# Patient Record
Sex: Male | Born: 1965 | Race: Black or African American | Hispanic: No | Marital: Married | Smoking: Former smoker
Health system: Southern US, Community
[De-identification: ages and names within clinical notes are randomized; demographics above are authoritative.]

## PROBLEM LIST (undated history)

## (undated) DIAGNOSIS — T7840XA Allergy, unspecified, initial encounter: Secondary | ICD-10-CM

## (undated) HISTORY — DX: Allergy, unspecified, initial encounter: T78.40XA

---

## 1997-07-20 ENCOUNTER — Other Ambulatory Visit: Admission: RE | Admit: 1997-07-20 | Discharge: 1997-07-20 | Payer: Self-pay | Admitting: Internal Medicine

## 2004-10-10 ENCOUNTER — Emergency Department (HOSPITAL_COMMUNITY): Admission: EM | Admit: 2004-10-10 | Discharge: 2004-10-10 | Payer: Self-pay | Admitting: Emergency Medicine

## 2004-12-22 ENCOUNTER — Emergency Department (HOSPITAL_COMMUNITY): Admission: EM | Admit: 2004-12-22 | Discharge: 2004-12-23 | Payer: Self-pay | Admitting: Emergency Medicine

## 2006-07-16 ENCOUNTER — Emergency Department (HOSPITAL_COMMUNITY): Admission: EM | Admit: 2006-07-16 | Discharge: 2006-07-16 | Payer: Self-pay | Admitting: Emergency Medicine

## 2008-09-06 ENCOUNTER — Emergency Department (HOSPITAL_COMMUNITY): Admission: EM | Admit: 2008-09-06 | Discharge: 2008-09-06 | Payer: Self-pay | Admitting: Family Medicine

## 2009-10-07 ENCOUNTER — Emergency Department (HOSPITAL_COMMUNITY): Admission: EM | Admit: 2009-10-07 | Discharge: 2009-10-07 | Payer: Self-pay | Admitting: Emergency Medicine

## 2013-10-13 LAB — HM COLONOSCOPY

## 2013-11-10 ENCOUNTER — Encounter (HOSPITAL_COMMUNITY): Payer: Self-pay | Admitting: Emergency Medicine

## 2013-11-10 ENCOUNTER — Emergency Department (HOSPITAL_COMMUNITY)
Admission: EM | Admit: 2013-11-10 | Discharge: 2013-11-10 | Disposition: A | Discharge status: Home / Self Care | Payer: BC Managed Care – PPO | Source: Home / Self Care

## 2013-11-10 DIAGNOSIS — N451 Epididymitis: Secondary | ICD-10-CM

## 2013-11-10 DIAGNOSIS — N508 Other specified disorders of male genital organs: Secondary | ICD-10-CM

## 2013-11-10 DIAGNOSIS — N50811 Right testicular pain: Secondary | ICD-10-CM

## 2013-11-10 LAB — POCT URINALYSIS DIP (DEVICE)
Bilirubin Urine: NEGATIVE
Glucose, UA: NEGATIVE mg/dL
Hgb urine dipstick: NEGATIVE
Ketones, ur: NEGATIVE mg/dL
Leukocytes, UA: NEGATIVE
Nitrite: NEGATIVE
Protein, ur: 100 mg/dL — AB
Specific Gravity, Urine: 1.025 (ref 1.005–1.030)
Urobilinogen, UA: 0.2 mg/dL (ref 0.0–1.0)
pH: 6 (ref 5.0–8.0)

## 2013-11-10 MED ORDER — DOXYCYCLINE HYCLATE 100 MG PO CAPS
100.0000 mg | ORAL_CAPSULE | Freq: Two times a day (BID) | ORAL | Status: DC
Start: 2013-11-10 — End: 2015-07-24

## 2013-11-10 NOTE — ED Provider Notes (Signed)
CSN: [id number]     Arrival date & time 11/10/13  1242 History   First MD Initiated Contact with Patient 11/10/13 1313     Chief Complaint  Patient presents with  . Groin Pain   (Consider location/radiation/quality/duration/timing/severity/associated sxs/prior Treatment) HPI Comments: 48 year old male complaining of discomfort to the left testicle for approximately one week. The pain is located to the posterior aspect and radiates into the right inguinal. The discomfort is intermittent. It is worse with ambulation and while running for exercise. At this time he is not having any pain. Is not associated with penile discharge or other urinary symptoms. He denies trauma.   History reviewed. No pertinent past medical history. History reviewed. No pertinent past surgical history. No family history on file. History  Substance Use Topics  . Smoking status: Never Smoker   . Smokeless tobacco: Not on file  . Alcohol Use: No    Review of Systems  Constitutional: Positive for activity change. Negative for fever and fatigue.  HENT: Negative.   Respiratory: Negative for cough and shortness of breath.   Cardiovascular: Negative.   Gastrointestinal: Negative.   Genitourinary: Positive for testicular pain. Negative for dysuria, urgency, frequency, hematuria, flank pain, decreased urine volume, discharge, penile swelling, scrotal swelling, genital sores and penile pain.    Allergies  Penicillins  Home Medications   Prior to Admission medications   Medication Sig Start Date End Date Taking? Authorizing Provider  doxycycline (VIBRAMYCIN) 100 MG capsule Take 1 capsule (100 mg total) by mouth 2 (two) times daily. 11/10/13   Hayden Rasmussenavid Kingston Guiles, NP   There were no vitals taken for this visit. Physical Exam  Nursing note and vitals reviewed. Constitutional: He is oriented to person, place, and time. He appears well-developed and well-nourished. No distress.  Neck: Normal range of motion. Neck supple.   Cardiovascular: Normal rate.   Pulmonary/Chest: Effort normal. No respiratory distress.  Genitourinary:  Normal external male genitalia. No external lesions observed palpated. Bilateral testicles are descended. There is no current tenderness of the scrotum, testicles or epididymal apparatus. The patient points to the posterior aspect of the right testicle as the usual source of discomfort. There is no discoloration, observed or palpated intrascrotal mass or fluid. Hernia finger tap testing is negative bilaterally. No epididymal tenderness, although this is an area of recurring discomfort. No penile discharge. No inguinal or pelvic lymph nodes palpated. No pelvic tenderness or mass.   Musculoskeletal: Normal range of motion. He exhibits no edema.  Neurological: He is alert and oriented to person, place, and time. He exhibits normal muscle tone. Coordination normal.  Skin: Skin is warm and dry.  Psychiatric: He has a normal mood and affect.    ED Course  Procedures (including critical care time) Labs Review Labs Reviewed  POCT URINALYSIS DIP (DEVICE) - Abnormal; Notable for the following:    Protein, ur 100 (*)    All other components within normal limits    Imaging Review No results found. Results for orders placed during the hospital encounter of 11/10/13  POCT URINALYSIS DIP (DEVICE)      Result Value Ref Range   Glucose, UA NEGATIVE  NEGATIVE mg/dL   Bilirubin Urine NEGATIVE  NEGATIVE   Ketones, ur NEGATIVE  NEGATIVE mg/dL   Specific Gravity, Urine 1.025  1.005 - 1.030   Hgb urine dipstick NEGATIVE  NEGATIVE   pH 6.0  5.0 - 8.0   Protein, ur 100 (*) NEGATIVE mg/dL   Urobilinogen, UA 0.2  0.0 - 1.0  mg/dL   Nitrite NEGATIVE  NEGATIVE   Leukocytes, UA NEGATIVE  NEGATIVE       MDM   1. Testicular pain, right   2. Epididymitis, right     No evidence of hernia or torsion. Ice elevate scrotum Limit provocative activity Doxy 100 bid F/U with PCP or here if not improving  or getting worse    Hayden Rasmussenavid Jahmeir Geisen, NP 11/10/13 1347

## 2013-11-10 NOTE — Discharge Instructions (Signed)
Epididymitis °Epididymitis is a swelling (inflammation) of the epididymis. The epididymis is a cord-like structure along the back part of the testicle. Epididymitis is usually, but not always, caused by infection. This is usually a sudden problem beginning with chills, fever and pain behind the scrotum and in the testicle. There may be swelling and redness of the testicle. °DIAGNOSIS  °Physical examination will reveal a tender, swollen epididymis. Sometimes, cultures are obtained from the urine or from prostate secretions to help find out if there is an infection or if the cause is a different problem. Sometimes, blood work is performed to see if your white blood cell count is elevated and if a germ (bacterial) or viral infection is present. Using this knowledge, an appropriate medicine which kills germs (antibiotic) can be chosen by your caregiver. A viral infection causing epididymitis will most often go away (resolve) without treatment. °HOME CARE INSTRUCTIONS  °· Hot sitz baths for 20 minutes, 4 times per day, may help relieve pain. °· Only take over-the-counter or prescription medicines for pain, discomfort or fever as directed by your caregiver. °· Take all medicines, including antibiotics, as directed. Take the antibiotics for the full prescribed length of time even if you are feeling better. °· It is very important to keep all follow-up appointments. °SEEK IMMEDIATE MEDICAL CARE IF:  °· You have a fever. °· You have pain not relieved with medicines. °· You have any worsening of your problems. °· Your pain seems to come and go. °· You develop pain, redness, and swelling in the scrotum and surrounding areas. °MAKE SURE YOU:  °· Understand these instructions. °· Will watch your condition. °· Will get help right away if you are not doing well or get worse. °Document Released: 01/05/2000 Document Revised: 04/01/2011 Document Reviewed: 11/24/2008 °ExitCare® Patient Information ©2015 [employer]. This information  is not intended to replace advice given to you by your health care provider. Make sure you discuss any questions you have with your health care provider. ° °Testicular Self-Exam °A self-exam of your testicles is looking at and feeling your testicles for abnormal lumps or swelling. Several things can cause swelling, lumps, or pain in your testicles. Some of these causes are: °· Injuries. °· Puffiness, redness, and soreness (inflammation). °· Infection. °· Extra fluids around your testicle. °· Twisted testicles. °· Testicular cancer. °The testicles are easiest to check after warm baths or showers. They are harder to examine when you are cold.  °Follow these steps while you are standing: °· Hold your penis away from your body. °· Roll one testicle between your thumb and finger. Feel the entire testicle. °· Roll the other testicle between your thumb and finger. Feel the entire testicle. °Feel for lumps, swelling, or discomfort. A normal testicle is egg shaped. It feels firm. It is smooth and not tender. The spermatic cord feels like a firm spaghetti-like cord. It is at the back of your testicle. Examine the crease between the front of your leg and your abdomen. Feel for any bumps that are tender. These could be enlarged lymph nodes.  °Document Released: 04/05/2008 Document Revised: 10/28/2012 Document Reviewed: 06/29/2012 °ExitCare® Patient Information ©2015 [employer]. This information is not intended to replace advice given to you by your health care provider. Make sure you discuss any questions you have with your health care provider. ° °

## 2013-11-10 NOTE — ED Provider Notes (Signed)
Medical screening examination/treatment/procedure(s) were performed by non-physician practitioner and as supervising physician I was immediately available for consultation/collaboration.  Charli Liberatore, M.D.  Daoud Lobue C Mykalah Saari, MD 11/10/13 1423 

## 2013-11-10 NOTE — ED Notes (Signed)
C/o pain on right side inguinal and right teste onset 1 week Pain increases w/activity Denies abd pain, f/v/n/d, urinary sx Alert, no signs of acute distress.

## 2015-07-24 ENCOUNTER — Encounter (HOSPITAL_COMMUNITY): Payer: Self-pay | Admitting: Emergency Medicine

## 2015-07-24 ENCOUNTER — Emergency Department (HOSPITAL_COMMUNITY)
Admission: EM | Admit: 2015-07-24 | Discharge: 2015-07-24 | Disposition: A | Discharge status: Home / Self Care | Payer: BLUE CROSS/BLUE SHIELD | Attending: Emergency Medicine | Admitting: Emergency Medicine

## 2015-07-24 ENCOUNTER — Emergency Department (HOSPITAL_COMMUNITY): Payer: BLUE CROSS/BLUE SHIELD

## 2015-07-24 DIAGNOSIS — Y9389 Activity, other specified: Secondary | ICD-10-CM | POA: Diagnosis not present

## 2015-07-24 DIAGNOSIS — Y999 Unspecified external cause status: Secondary | ICD-10-CM | POA: Insufficient documentation

## 2015-07-24 DIAGNOSIS — S62609B Fracture of unspecified phalanx of unspecified finger, initial encounter for open fracture: Secondary | ICD-10-CM

## 2015-07-24 DIAGNOSIS — Z79899 Other long term (current) drug therapy: Secondary | ICD-10-CM | POA: Insufficient documentation

## 2015-07-24 DIAGNOSIS — W293XXA Contact with powered garden and outdoor hand tools and machinery, initial encounter: Secondary | ICD-10-CM | POA: Diagnosis not present

## 2015-07-24 DIAGNOSIS — S62663B Nondisplaced fracture of distal phalanx of left middle finger, initial encounter for open fracture: Secondary | ICD-10-CM | POA: Diagnosis not present

## 2015-07-24 DIAGNOSIS — Y929 Unspecified place or not applicable: Secondary | ICD-10-CM | POA: Insufficient documentation

## 2015-07-24 DIAGNOSIS — S61213A Laceration without foreign body of left middle finger without damage to nail, initial encounter: Secondary | ICD-10-CM | POA: Diagnosis present

## 2015-07-24 MED ORDER — BACITRACIN ZINC 500 UNIT/GM EX OINT
TOPICAL_OINTMENT | Freq: Two times a day (BID) | CUTANEOUS | Status: DC
  Administered 2015-07-24: 1 via TOPICAL

## 2015-07-24 MED ORDER — TETANUS-DIPHTH-ACELL PERTUSSIS 5-2.5-18.5 LF-MCG/0.5 IM SUSP
0.5000 mL | Freq: Once | INTRAMUSCULAR | Status: AC
  Administered 2015-07-24: 0.5 mL via INTRAMUSCULAR
  Filled 2015-07-24: qty 0.5

## 2015-07-24 MED ORDER — HYDROCODONE-ACETAMINOPHEN 5-325 MG PO TABS: 2.0000 | ORAL_TABLET | ORAL | Status: DC | PRN

## 2015-07-24 MED ORDER — MUPIROCIN CALCIUM 2 % EX CREA
TOPICAL_CREAM | Freq: Once | CUTANEOUS | Status: DC
  Filled 2015-07-24: qty 15

## 2015-07-24 MED ORDER — CIPROFLOXACIN HCL 500 MG PO TABS: 500.0000 mg | ORAL_TABLET | Freq: Two times a day (BID) | ORAL | Status: DC

## 2015-07-24 NOTE — ED Provider Notes (Signed)
CSN: [id number]     Arrival date & time 07/24/15  [id number] History   First MD Initiated Contact with Patient 07/24/15 1001     Chief Complaint  Patient presents with  . Finger Injury     (Consider location/radiation/quality/duration/timing/severity/associated sxs/prior Treatment) HPI  Corey Ayala is a 50 y.o M with No significant past medical history who presents to the ED today complaining of a laceration to his left middle finger. Patient states that he was cutting his hedges with hedge trimmers when one accidentally slipped the end of his left middle finger. Laceration goes through his nail. Bleeding is minimal. He initially washed off with water and applied pressure. His last tetanus was well over 10 years ago.   History reviewed. No pertinent past medical history. History reviewed. No pertinent past surgical history. History reviewed. No pertinent family history. Social History  Substance Use Topics  . Smoking status: Never Smoker   . Smokeless tobacco: None  . Alcohol Use: No    Review of Systems  All other systems reviewed and are negative.     Allergies  Penicillins  Home Medications   Prior to Admission medications   Medication Sig Start Date End Date Taking? Authorizing Provider  Multiple Vitamins-Minerals (MULTIVITAMIN) LIQD Take 5 mLs by mouth daily.   Yes Historical Provider, MD  OVER THE COUNTER MEDICATION Take 1 tablet by mouth daily. Chaga- mushroom..   Yes Historical Provider, MD   BP 114/77 mmHg  Pulse 76  Temp(Src) 98.2 F (36.8 C) (Oral)  Resp 18  SpO2 96% Physical Exam  Constitutional: He is oriented to person, place, and time. He appears well-developed and well-nourished. No distress.  HENT:  Head: Normocephalic and atraumatic.  Eyes: Conjunctivae are normal. Right eye exhibits no discharge. Left eye exhibits no discharge. No scleral icterus.  Cardiovascular: Normal rate.   Pulmonary/Chest: Effort normal.  Musculoskeletal:  No decrease ROM  of digits.   Neurological: He is alert and oriented to person, place, and time. Coordination normal.  No sensory deficits.  Skin: Skin is warm and dry. No rash noted. He is not diaphoretic. No erythema. No pallor.  2cm laceration at distal tip of left middle finger. Laceration extends from middle of nail to finger pad. No disruption of nail fold. Intact distal pulses.   Psychiatric: He has a normal mood and affect. His behavior is normal.  Nursing note and vitals reviewed.   ED Course  Procedures (including critical care time) Labs Review Labs Reviewed - No data to display  Imaging Review Dg Hand Complete Left  07/24/2015  CLINICAL DATA:  Left middle finger are injury, possible fracture EXAM: LEFT HAND - COMPLETE 3+ VIEW COMPARISON:  None. FINDINGS: Three views of the left hand submitted. There is nondisplaced fracture at the tip of distal phalanx third finger. There is soft tissue irregularity probable laceration. Open fracture cannot be excluded. IMPRESSION: There is nondisplaced fracture at the tip of distal phalanx third finger. There is soft tissue irregularity probable laceration. Open fracture cannot be excluded. Clinical correlation is necessary. Electronically Signed   By: Natasha MeadLiviu  Pop M.D.   On: 07/24/2015 11:47   I have personally reviewed and evaluated these images and lab results as part of my medical decision-making.   EKG Interpretation None      MDM   Final diagnoses:  Open fracture of finger, initial encounter    Tdap booster given. Pressure irrigation performed. Xray reveals nondisplaced fx at the tip of distal phalanx. There is an  overlying laceration through the nail without disruption of the nail fold. Fx is considered an open fracture. Spoke with Dr. Ophelia CharterYates who recommends dressing the wound and placing pt on abx. Dr. Ophelia CharterYates will see pt in his office on Friday. Will not repair lac in the ED. Pt has no hx of renal dysfunction, rx for cipro given. Pt has no co morbidities  to effect normal wound healing. Pt is hemodynamically stable w no complaints prior to dc.  Return precautions outlined in patient discharge instructions.      Lester KinsmanSamantha Tripp AntimonyDowless, PA-C 07/24/15 1441  Dione Boozeavid Glick, MD 07/24/15 [id number]

## 2015-07-24 NOTE — ED Notes (Signed)
Pt was trimming hedges this am. Hedge trimmer slipped and hit L middle finger. Pt has laceration through nail and around to pad of finger. Bleeding minimal in triage. Last tetanus over 10 years ago.

## 2015-07-24 NOTE — Discharge Instructions (Signed)
Finger Fracture Fractures of fingers are breaks in the bones of the fingers. There are many types of fractures. There are different ways of treating these fractures. Your health care provider will discuss the best way to treat your fracture. CAUSES Traumatic injury is the main cause of broken fingers. These include:  Injuries while playing sports.  Workplace injuries.  Falls. RISK FACTORS Activities that can increase your risk of finger fractures include:  Sports.  Workplace activities that involve machinery.  A condition called osteoporosis, which can make your bones less dense and cause them to fracture more easily. SIGNS AND SYMPTOMS The main symptoms of a broken finger are pain and swelling within 15 minutes after the injury. Other symptoms include:  Bruising of your finger.  Stiffness of your finger.  Numbness of your finger.  Exposed bones (compound fracture) if the fracture is severe. DIAGNOSIS  The best way to diagnose a broken bone is with X-ray imaging. Additionally, your health care provider will use this X-ray image to evaluate the position of the broken finger bones.  TREATMENT  Finger fractures can be treated with:   Nonreduction--This means the bones are in place. The finger is splinted without changing the positions of the bone pieces. The splint is usually left on for about a week to 10 days. This will depend on your fracture and what your health care provider thinks.  Closed reduction--The bones are put back into position without using surgery. The finger is then splinted.  Open reduction and internal fixation--The fracture site is opened. Then the bone pieces are fixed into place with pins or some type of hardware. This is seldom required. It depends on the severity of the fracture. HOME CARE INSTRUCTIONS   Follow your health care provider's instructions regarding activities, exercises, and physical therapy.  Only take over-the-counter or prescription  medicines for pain, discomfort, or fever as directed by your health care provider. SEEK MEDICAL CARE IF: You have pain or swelling that limits the motion or use of your fingers. SEEK IMMEDIATE MEDICAL CARE IF:  Your finger becomes numb. MAKE SURE YOU:   Understand these instructions.  Will watch your condition.  Will get help right away if you are not doing well or get worse.   This information is not intended to replace advice given to you by your health care provider. Make sure you discuss any questions you have with your health care provider.   Call and schedule with Dr. Ophelia CharterYates for Friday. This is a [employer] surgeon. Take antibiotics as prescribed. You may wash the wound with a torturous up and water. Otherwise keep this clean and dry. Return to the ED sooner if you experience severe worsening of your symptoms, increased redness or swelling around your wound, fevers, chills.

## 2017-05-26 DIAGNOSIS — Z0000 Encounter for general adult medical examination without abnormal findings: Secondary | ICD-10-CM | POA: Diagnosis not present

## 2017-05-26 DIAGNOSIS — E559 Vitamin D deficiency, unspecified: Secondary | ICD-10-CM | POA: Diagnosis not present

## 2017-05-26 DIAGNOSIS — Z125 Encounter for screening for malignant neoplasm of prostate: Secondary | ICD-10-CM | POA: Diagnosis not present

## 2017-05-26 DIAGNOSIS — E669 Obesity, unspecified: Secondary | ICD-10-CM | POA: Diagnosis not present

## 2017-05-26 DIAGNOSIS — R7303 Prediabetes: Secondary | ICD-10-CM | POA: Diagnosis not present

## 2018-06-01 ENCOUNTER — Other Ambulatory Visit: Payer: Self-pay | Admitting: Nurse Practitioner

## 2018-06-01 ENCOUNTER — Other Ambulatory Visit: Payer: Self-pay

## 2018-06-01 ENCOUNTER — Encounter: Payer: Self-pay | Admitting: Nurse Practitioner

## 2018-06-01 ENCOUNTER — Ambulatory Visit (INDEPENDENT_AMBULATORY_CARE_PROVIDER_SITE_OTHER): Payer: 59 | Admitting: Nurse Practitioner

## 2018-06-01 VITALS — BP 110/80 | HR 75 | Temp 98.2°F | Ht 70.8 in | Wt 209.8 lb

## 2018-06-01 DIAGNOSIS — Z1159 Encounter for screening for other viral diseases: Secondary | ICD-10-CM | POA: Diagnosis not present

## 2018-06-01 DIAGNOSIS — R7309 Other abnormal glucose: Secondary | ICD-10-CM

## 2018-06-01 DIAGNOSIS — E559 Vitamin D deficiency, unspecified: Secondary | ICD-10-CM | POA: Diagnosis not present

## 2018-06-01 DIAGNOSIS — Z0000 Encounter for general adult medical examination without abnormal findings: Secondary | ICD-10-CM | POA: Diagnosis not present

## 2018-06-01 DIAGNOSIS — Z125 Encounter for screening for malignant neoplasm of prostate: Secondary | ICD-10-CM | POA: Diagnosis not present

## 2018-06-01 DIAGNOSIS — Z139 Encounter for screening, unspecified: Secondary | ICD-10-CM

## 2018-06-01 LAB — POCT URINALYSIS DIPSTICK
Bilirubin, UA: NEGATIVE
Glucose, UA: NEGATIVE
Ketones, UA: NEGATIVE
Leukocytes, UA: NEGATIVE
Nitrite, UA: NEGATIVE
Protein, UA: POSITIVE — AB
Spec Grav, UA: 1.025 (ref 1.010–1.025)
Urobilinogen, UA: 0.2 E.U./dL
pH, UA: 5.5 (ref 5.0–8.0)

## 2018-06-01 NOTE — Patient Instructions (Signed)
Health Maintenance  Topic Date Due  . COLONOSCOPY  03/02/2013  . INFLUENZA VACCINE  08/22/2018  . TETANUS/TDAP  07/23/2025  . Hepatitis C Screening  Completed  . HIV Screening  Completed   Health Maintenance, Male A healthy lifestyle and preventive care is important for your health and wellness. Ask your health care provider about what schedule of regular examinations is right for you. What should I know about weight and diet? Eat a Healthy Diet  Eat plenty of vegetables, fruits, whole grains, low-fat dairy products, and lean protein.  Do not eat a lot of foods high in solid fats, added sugars, or salt.  Maintain a Healthy Weight Regular exercise can help you achieve or maintain a healthy weight. You should:  Do at least 150 minutes of exercise each week. The exercise should increase your heart rate and make you sweat (moderate-intensity exercise).  Do strength-training exercises at least twice a week. Watch Your Levels of Cholesterol and Blood Lipids  Have your blood tested for lipids and cholesterol every 5 years starting at 55 years of age. If you are at high risk for heart disease, you should start having your blood tested when you are 55 years old. You may need to have your cholesterol levels checked more often if: ? Your lipid or cholesterol levels are high. ? You are older than 55 years of age. ? You are at high risk for heart disease. What should I know about cancer screening? Many types of cancers can be detected early and may often be prevented. Lung Cancer  You should be screened every year for lung cancer if: ? You are a current smoker who has smoked for at least 30 years. ? You are a former smoker who has quit within the past 15 years.  Talk to your health care provider about your screening options, when you should start screening, and how often you should be screened. Colorectal Cancer  Routine colorectal cancer screening usually begins at 55 years of age and  should be repeated every 5-10 years until you are 55 years old. You may need to be screened more often if early forms of precancerous polyps or small growths are found. Your health care provider may recommend screening at an earlier age if you have risk factors for colon cancer.  Your health care provider may recommend using home test kits to check for hidden blood in the stool.  A small camera at the end of a tube can be used to examine your colon (sigmoidoscopy or colonoscopy). This checks for the earliest forms of colorectal cancer. Prostate and Testicular Cancer  Depending on your age and overall health, your health care provider may do certain tests to screen for prostate and testicular cancer.  Talk to your health care provider about any symptoms or concerns you have about testicular or prostate cancer. Skin Cancer  Check your skin from head to toe regularly.  Tell your health care provider about any new moles or changes in moles, especially if: ? There is a change in a mole's size, shape, or color. ? You have a mole that is larger than a pencil eraser.  Always use sunscreen. Apply sunscreen liberally and repeat throughout the day.  Protect yourself by wearing long sleeves, pants, a wide-brimmed hat, and sunglasses when outside. What should I know about heart disease, diabetes, and high blood pressure?  If you are 7118-55 years of age, have your blood pressure checked every 3-5 years. If you are 40  years of age or older, have your blood pressure checked every year. You should have your blood pressure measured twice-once when you are at a hospital or clinic, and once when you are not at a hospital or clinic. Record the average of the two measurements. To check your blood pressure when you are not at a hospital or clinic, you can use: ? An automated blood pressure machine at a pharmacy. ? A home blood pressure monitor.  Talk to your health care provider about your target blood pressure.   If you are between 35-85 years old, ask your health care provider if you should take aspirin to prevent heart disease.  Have regular diabetes screenings by checking your fasting blood sugar level. ? If you are at a normal weight and have a low risk for diabetes, have this test once every three years after the age of 98. ? If you are overweight and have a high risk for diabetes, consider being tested at a younger age or more often.  A one-time screening for abdominal aortic aneurysm (AAA) by ultrasound is recommended for men aged 65-75 years who are current or former smokers. What should I know about preventing infection? Hepatitis B If you have a higher risk for hepatitis B, you should be screened for this virus. Talk with your health care provider to find out if you are at risk for hepatitis B infection. Hepatitis C Blood testing is recommended for:  Everyone born from [birth year] through 1967.  Anyone with known risk factors for hepatitis C. Sexually Transmitted Diseases (STDs)  You should be screened each year for STDs including gonorrhea and chlamydia if: ? You are sexually active and are younger than 55 years of age. ? You are older than 55 years of age and your health care provider tells you that you are at risk for this type of infection. ? Your sexual activity has changed since you were last screened and you are at an increased risk for chlamydia or gonorrhea. Ask your health care provider if you are at risk.  Talk with your health care provider about whether you are at high risk of being infected with HIV. Your health care provider may recommend a prescription medicine to help prevent HIV infection. What else can I do?  Schedule regular health, dental, and eye exams.  Stay current with your vaccines (immunizations).  Do not use any tobacco products, such as cigarettes, chewing tobacco, and e-cigarettes. If you need help quitting, ask your health care provider.  Limit alcohol  intake to no more than 2 drinks per day. One drink equals 12 ounces of beer, 5 ounces of wine, or 1 ounces of hard liquor.  Do not use street drugs.  Do not share needles.  Ask your health care provider for help if you need support or information about quitting drugs.  Tell your health care provider if you often feel depressed.  Tell your health care provider if you have ever been abused or do not feel safe at home. This information is not intended to replace advice given to you by your health care provider. Make sure you discuss any questions you have with your health care provider. Document Released: 07/06/2007 Document Revised: 09/06/2015 Document Reviewed: 10/11/2014 Elsevier Interactive Patient Education  2019 [employer].

## 2018-06-01 NOTE — Progress Notes (Signed)
Subjective:     Patient ID: Corey Ayala , male    DOB: 1965/12/27 , 53 y.o.   MRN: [id number]   Chief Complaint  Patient presents with  . Annual Exam   Men's preventive visit. Patient Health Questionnaire (PHQ-2) is    Office Visit from 06/01/2018 in [facility]  PHQ-2 Total Score  0     Patient is on a regular diet, low carbohydrate, limited sweets and fast food. Marital status: Married. Relevant history for alcohol use is:   Social History   Substance and Sexual Activity  Alcohol Use No   Relevant history for tobacco use is:  Social History   Tobacco Use  Smoking Status Never Smoker  Smokeless Tobacco Never Used   HPI  Here for HM    History reviewed. No pertinent past medical history.   History reviewed. No pertinent family history.   Current Outpatient Medications:  Marland Kitchen  Multiple Vitamin (MULTIVITAMIN) tablet, Take 1 tablet by mouth daily., Disp: , Rfl:    Allergies  Allergen Reactions  . Penicillins Swelling and Rash    Has patient had a PCN reaction causing immediate rash, facial/tongue/throat swelling, SOB or lightheadedness with hypotension: yes Has patient had a PCN reaction causing severe rash involving mucus membranes or skin necrosis: unknown Has patient had a PCN reaction that required hospitalization: yes office visit If all of the above answers are "NO", then may proceed with Cephalosporin use. Has patient had a PCN reaction occurring within the last 10 years: no     Review of Systems  Constitutional: Negative.   HENT: Negative.   Eyes: Negative.   Respiratory: Negative.   Cardiovascular: Negative.   Gastrointestinal: Negative.   Endocrine: Negative.   Genitourinary: Negative.   Musculoskeletal: Negative.   Allergic/Immunologic: Negative.   Neurological: Negative.   Hematological: Negative.   Psychiatric/Behavioral: Negative.      Today's Vitals   06/01/18 1009  BP: 110/80  Pulse: 75  Temp: 98.2 F (36.8  C)  TempSrc: Oral  Weight: 209 lb 12.8 oz (95.2 kg)  Height: 5' 10.8" (1.798 m)  PainSc: 0-No pain   Body mass index is 29.43 kg/m.   Objective:  Physical Exam Constitutional:      Appearance: Normal appearance. He is not ill-appearing.  HENT:     Head: Normocephalic and atraumatic.     Right Ear: Tympanic membrane, ear canal and external ear normal. There is no impacted cerumen.     Left Ear: Tympanic membrane, ear canal and external ear normal. There is no impacted cerumen.  Eyes:     Extraocular Movements: Extraocular movements intact.  Neck:     Musculoskeletal: Normal range of motion and neck supple. Muscular tenderness (left neck tension) present.  Cardiovascular:     Rate and Rhythm: Normal rate and regular rhythm.     Pulses: Normal pulses.     Heart sounds: Normal heart sounds. No murmur.  Pulmonary:     Effort: Pulmonary effort is normal.     Breath sounds: Normal breath sounds.  Abdominal:     General: Abdomen is flat. Bowel sounds are normal. There is no distension.     Palpations: Abdomen is soft.  Genitourinary:    Prostate: Normal.  Musculoskeletal: Normal range of motion.        General: No swelling or tenderness.  Lymphadenopathy:     Cervical: No cervical adenopathy.  Skin:    General: Skin is warm and dry.     Capillary  Refill: Capillary refill takes less than 2 seconds.  Neurological:     General: No focal deficit present.     Mental Status: He is alert and oriented to person, place, and time.     Cranial Nerves: No cranial nerve deficit.  Psychiatric:        Mood and Affect: Mood normal.        Behavior: Behavior normal.        Thought Content: Thought content normal.        Judgment: Judgment normal.         Assessment And Plan:     1. Encounter for general adult medical examination w/o abnormal findings . Behavior modifications discussed and diet history reviewed.   . Pt will continue to exercise regularly and modify diet with low GI,  plant based foods and decrease intake of processed foods.  . Recommend intake of daily multivitamin, Vitamin D, and calcium.  . Recommend for preventive screenings, as well as recommend immunizations that include influenza, TDAP (up to date) - POCT Urinalysis Dipstick (81002) - CBC no Diff - CMP14 + Anion Gap  2. Encounter for hepatitis C screening test for low risk patient  Will check for Hepatitis C screening due to being born between the years 1945-1965 - Hepatitis C antibody  3. Encounter for prostate cancer screening  Manual prostate exam done no abnormal findings. - PSA  4. Encounter for screening  - HIV antibody (with reflex)  5. Vitamin D deficiency  Will check vitamin D level and supplement as needed.     Also encouraged to spend 15 minutes in the sun daily.  - Vitamin D 1,25 Dihydroxy  6. Abnormal glucose Chronic, stable No current medications Encouraged to limit intake of sugary foods and drinks Encouraged to increase physical activity to 150 minutes per week If still elevated will follow up in 3-4 months for recheck   - Hemoglobin A1c    Arnette FeltsJanece Mcgregor Tinnon, FNP    THE PATIENT IS ENCOURAGED TO PRACTICE SOCIAL DISTANCING DUE TO THE COVID-19 PANDEMIC.

## 2018-06-04 LAB — CMP14 + ANION GAP
ALT: 20 IU/L (ref 0–44)
AST: 19 IU/L (ref 0–40)
Albumin/Globulin Ratio: 1.8 (ref 1.2–2.2)
Albumin: 4.6 g/dL (ref 3.8–4.9)
Alkaline Phosphatase: 94 IU/L (ref 39–117)
Anion Gap: 16 mmol/L (ref 10.0–18.0)
BUN/Creatinine Ratio: 13 (ref 9–20)
BUN: 19 mg/dL (ref 6–24)
Bilirubin Total: 0.3 mg/dL (ref 0.0–1.2)
CO2: 19 mmol/L — ABNORMAL LOW (ref 20–29)
Calcium: 9.8 mg/dL (ref 8.7–10.2)
Chloride: 105 mmol/L (ref 96–106)
Creatinine, Ser: 1.42 mg/dL — ABNORMAL HIGH (ref 0.76–1.27)
GFR calc Af Amer: 64 mL/min/{1.73_m2} (ref >59)
GFR calc non Af Amer: 55 mL/min/{1.73_m2} — ABNORMAL LOW (ref >59)
Globulin, Total: 2.5 g/dL (ref 1.5–4.5)
Glucose: 116 mg/dL — ABNORMAL HIGH (ref 65–99)
Potassium: 4.6 mmol/L (ref 3.5–5.2)
Sodium: 140 mmol/L (ref 134–144)
Total Protein: 7.1 g/dL (ref 6.0–8.5)

## 2018-06-04 LAB — HIV ANTIBODY (ROUTINE TESTING W REFLEX): HIV Screen 4th Generation wRfx: NONREACTIVE

## 2018-06-04 LAB — CBC
Hematocrit: 43.8 % (ref 37.5–51.0)
Hemoglobin: 15.5 g/dL (ref 13.0–17.7)
MCH: 29.5 pg (ref 26.6–33.0)
MCHC: 35.4 g/dL (ref 31.5–35.7)
MCV: 83 fL (ref 79–97)
Platelets: 297 10*3/uL (ref 150–450)
RBC: 5.25 x10E6/uL (ref 4.14–5.80)
RDW: 12.3 % (ref 11.6–15.4)
WBC: 4.1 10*3/uL (ref 3.4–10.8)

## 2018-06-04 LAB — HEMOGLOBIN A1C
Est. average glucose Bld gHb Est-mCnc: 134 mg/dL
Hgb A1c MFr Bld: 6.3 % — ABNORMAL HIGH (ref 4.8–5.6)

## 2018-06-04 LAB — PSA: Prostate Specific Ag, Serum: 1.3 ng/mL (ref 0.0–4.0)

## 2018-06-04 LAB — VITAMIN D 1,25 DIHYDROXY
Vitamin D 1, 25 (OH)2 Total: 64 pg/mL
Vitamin D2 1, 25 (OH)2: <10 pg/mL
Vitamin D3 1, 25 (OH)2: 64 pg/mL

## 2018-06-04 LAB — HEPATITIS C ANTIBODY: Hep C Virus Ab: <0.1 s/co ratio (ref 0.0–0.9)

## 2018-10-05 ENCOUNTER — Encounter: Payer: Self-pay | Admitting: Nurse Practitioner

## 2018-10-05 ENCOUNTER — Ambulatory Visit (INDEPENDENT_AMBULATORY_CARE_PROVIDER_SITE_OTHER): Payer: 59 | Admitting: Nurse Practitioner

## 2018-10-05 ENCOUNTER — Other Ambulatory Visit: Payer: Self-pay

## 2018-10-05 VITALS — BP 110/80 | HR 69 | Temp 98.0°F | Ht 71.8 in | Wt 205.2 lb

## 2018-10-05 DIAGNOSIS — R7309 Other abnormal glucose: Secondary | ICD-10-CM | POA: Diagnosis not present

## 2018-10-05 NOTE — Progress Notes (Signed)
  Subjective:     Patient ID: Corey Ayala , male    DOB: January 02, 1966 , 53 y.o.   MRN: [id number]   Chief Complaint  Patient presents with  . PREDIABETES    Patient presents today for a 3-4 month f/u     HPI  Wt Readings from Last 3 Encounters: 10/05/18 : 205 lb 3.2 oz (93.1 kg) 06/01/18 : 209 lb 12.8 oz (95.2 kg)  Tries to avoid fried and fatty foods. Exercising by running 3 days a week and 2 days a week with other physical activity.   Diabetes He presents for his follow-up diabetic visit. Diabetes type: prediabetes. His disease course has been stable. Pertinent negatives for hypoglycemia include no dizziness or headaches. There are no diabetic associated symptoms. Pertinent negatives for diabetes include no chest pain. When asked about current treatments, none were reported.     No past medical history on file.   No family history on file.   Current Outpatient Medications:  Marland Kitchen  Multiple Vitamin (MULTIVITAMIN) tablet, Take 1 tablet by mouth daily., Disp: , Rfl:    Allergies  Allergen Reactions  . Penicillins Swelling and Rash    Has patient had a PCN reaction causing immediate rash, facial/tongue/throat swelling, SOB or lightheadedness with hypotension: yes Has patient had a PCN reaction causing severe rash involving mucus membranes or skin necrosis: unknown Has patient had a PCN reaction that required hospitalization: yes office visit If all of the above answers are "NO", then may proceed with Cephalosporin use. Has patient had a PCN reaction occurring within the last 10 years: no     Review of Systems  Constitutional: Negative.   Respiratory: Negative.  Negative for apnea.   Cardiovascular: Negative.  Negative for chest pain and palpitations.  Neurological: Negative for dizziness and headaches.  Psychiatric/Behavioral: Negative.      Today's Vitals   10/05/18 1011  BP: 110/80  Pulse: 69  Temp: 98 F (36.7 C)  TempSrc: Oral  Weight: 205 lb 3.2 oz (93.1 kg)   Height: 5' 11.8" (1.824 m)  PainSc: 0-No pain   Body mass index is 27.99 kg/m.   Objective:  Physical Exam Constitutional:      Appearance: Normal appearance.  Cardiovascular:     Rate and Rhythm: Normal rate and regular rhythm.     Pulses: Normal pulses.     Heart sounds: Normal heart sounds. No murmur.  Pulmonary:     Effort: Pulmonary effort is normal. No respiratory distress.     Breath sounds: Normal breath sounds.  Skin:    General: Skin is warm.     Capillary Refill: Capillary refill takes less than 2 seconds.  Neurological:     General: No focal deficit present.     Mental Status: He is alert and oriented to person, place, and time.         Assessment And Plan:     1. Abnormal glucose  Chronic, controlled  No current medications  Encouraged to limit intake of sugary foods and drinks  Continue to increase physical activity to 150 minutes per week      Minette Brine, FNP    THE PATIENT IS ENCOURAGED TO PRACTICE SOCIAL DISTANCING DUE TO THE COVID-19 PANDEMIC.

## 2018-10-06 LAB — HEMOGLOBIN A1C
Est. average glucose Bld gHb Est-mCnc: 128 mg/dL
Hgb A1c MFr Bld: 6.1 % — ABNORMAL HIGH (ref 4.8–5.6)

## 2018-11-30 ENCOUNTER — Encounter: Payer: Self-pay | Admitting: Nurse Practitioner

## 2019-02-15 ENCOUNTER — Encounter: Payer: Self-pay | Admitting: Nurse Practitioner

## 2019-02-15 ENCOUNTER — Ambulatory Visit: Payer: 59 | Admitting: Nurse Practitioner

## 2019-02-15 ENCOUNTER — Other Ambulatory Visit: Payer: Self-pay

## 2019-02-15 VITALS — BP 110/76 | HR 68 | Temp 98.5°F | Ht 70.2 in | Wt 211.2 lb

## 2019-02-15 DIAGNOSIS — Z1211 Encounter for screening for malignant neoplasm of colon: Secondary | ICD-10-CM

## 2019-02-15 DIAGNOSIS — J00 Acute nasopharyngitis [common cold]: Secondary | ICD-10-CM

## 2019-02-15 DIAGNOSIS — R7309 Other abnormal glucose: Secondary | ICD-10-CM | POA: Diagnosis not present

## 2019-02-15 DIAGNOSIS — Z20822 Contact with and (suspected) exposure to covid-19: Secondary | ICD-10-CM

## 2019-02-15 NOTE — Patient Instructions (Signed)
COVID-19: Quarantine vs. Isolation QUARANTINE keeps someone who was in close contact with someone who has COVID-19 away from others. If you had close contact with a person who has COVID-19  Stay home until 14 days after your last contact.  Check your temperature twice a day and watch for symptoms of COVID-19.  If possible, stay away from people who are at higher-risk for getting very sick from COVID-19. ISOLATION keeps someone who is sick or tested positive for COVID-19 without symptoms away from others, even in their own home. If you are sick and think or know you have COVID-19  Stay home until after ? At least 10 days since symptoms first appeared and ? At least 24 hours with no fever without fever-reducing medication and ? Symptoms have improved If you tested positive for COVID-19 but do not have symptoms  Stay home until after ? 10 days have passed since your positive test If you live with others, stay in a specific "sick room" or area and away from other people or animals, including pets. Use a separate bathroom, if available. cdc.gov/coronavirus 08/10/2018 This information is not intended to replace advice given to you by your health care provider. Make sure you discuss any questions you have with your health care provider. Document Revised: 12/24/2018 Document Reviewed: 12/24/2018 Elsevier Patient Education  2020 Elsevier Inc.  

## 2019-02-15 NOTE — Progress Notes (Signed)
Subjective:     Patient ID: Corey Ayala , male    DOB: Jun 03, 1965 , 54 y.o.   MRN: [id number]   Chief Complaint  Patient presents with  . prediabetes    patient presents today for a 4 monh f/u on diabetes    HPI  Wt Readings from Last 3 Encounters: 02/15/19 : 211 lb 3.2 oz (95.8 kg) 10/05/18 : 205 lb 3.2 oz (93.1 kg) 06/01/18 : 209 lb 12.8 oz (95.2 kg)  He is taking a multivitamin and a tea (for the immune system).  He is trying to stay well hydrated with water.  Drinks coffee, water and tea, and occasionally drink ginger beer.    On Wednesday had head congestion at night, will wake up with no covers on.  Denies any other symptoms of headache,   Diabetes He presents for his follow-up diabetic visit. Diabetes type: prediabetes. His disease course has been stable. Pertinent negatives for hypoglycemia include no dizziness or headaches. There are no diabetic associated symptoms. Pertinent negatives for diabetes include no chest pain, no fatigue, no polydipsia, no polyphagia, no polyuria and no weakness. There are no hypoglycemic complications. There are no diabetic complications. Risk factors for coronary artery disease include obesity. When asked about current treatments, none were reported. He has not had a previous visit with a dietitian.     No past medical history on file.   No family history on file.   Current Outpatient Medications:  Marland Kitchen  Multiple Vitamin (MULTIVITAMIN) tablet, Take 1 tablet by mouth daily., Disp: , Rfl:    Allergies  Allergen Reactions  . Penicillins Swelling and Rash    Has patient had a PCN reaction causing immediate rash, facial/tongue/throat swelling, SOB or lightheadedness with hypotension: yes Has patient had a PCN reaction causing severe rash involving mucus membranes or skin necrosis: unknown Has patient had a PCN reaction that required hospitalization: yes office visit If all of the above answers are "NO", then may proceed with Cephalosporin  use. Has patient had a PCN reaction occurring within the last 10 years: no     Review of Systems  Constitutional: Negative.  Negative for fatigue.  Respiratory: Negative.  Negative for apnea.   Cardiovascular: Negative.  Negative for chest pain and palpitations.  Endocrine: Negative for polydipsia, polyphagia and polyuria.  Neurological: Negative for dizziness, weakness and headaches.  Psychiatric/Behavioral: Negative.      Today's Vitals   02/15/19 1016  BP: 110/76  Pulse: 68  Temp: 98.5 F (36.9 C)  TempSrc: Oral  Weight: 211 lb 3.2 oz (95.8 kg)  Height: 5' 10.2" (1.783 m)  PainSc: 0-No pain   Body mass index is 30.13 kg/m.   Objective:  Physical Exam Constitutional:      General: He is not in acute distress.    Appearance: Normal appearance.  Cardiovascular:     Rate and Rhythm: Normal rate and regular rhythm.     Pulses: Normal pulses.     Heart sounds: Normal heart sounds. No murmur.  Pulmonary:     Effort: Pulmonary effort is normal. No respiratory distress.     Breath sounds: Normal breath sounds.  Skin:    General: Skin is warm.     Capillary Refill: Capillary refill takes less than 2 seconds.  Neurological:     General: No focal deficit present.     Mental Status: He is alert and oriented to person, place, and time.  Psychiatric:        Mood and Affect:  Mood normal.        Behavior: Behavior normal.        Thought Content: Thought content normal.        Judgment: Judgment normal.         Assessment And Plan:     1. Abnormal glucose  Chronic, stable  Continue with current medications - Hemoglobin A1c - BMP8+eGFR  2. Acute rhinitis Has been having intermittent episodes of rhinitis Due to the pandemic will check for coronavirus Advised to remain in self isolation until he has his results - Novel Coronavirus, NAA ([facility])  3. Encounter for screening colonoscopy  According to USPTF Colorectal cancer Screening guidelines. Colonoscopy is  recommended every 10 years, starting at age 66years.  Will refer to GI for colon cancer screening. - Ambulatory referral to Gastroenterology      Minette Brine, FNP    THE PATIENT IS ENCOURAGED TO PRACTICE SOCIAL DISTANCING DUE TO THE COVID-19 PANDEMIC.

## 2019-02-16 LAB — BMP8+EGFR
BUN/Creatinine Ratio: 15 (ref 9–20)
BUN: 20 mg/dL (ref 6–24)
CO2: 25 mmol/L (ref 20–29)
Calcium: 9.6 mg/dL (ref 8.7–10.2)
Chloride: 104 mmol/L (ref 96–106)
Creatinine, Ser: 1.3 mg/dL — ABNORMAL HIGH (ref 0.76–1.27)
GFR calc Af Amer: 71 mL/min/{1.73_m2} (ref >59)
GFR calc non Af Amer: 61 mL/min/{1.73_m2} (ref >59)
Glucose: 109 mg/dL — ABNORMAL HIGH (ref 65–99)
Potassium: 4.9 mmol/L (ref 3.5–5.2)
Sodium: 143 mmol/L (ref 134–144)

## 2019-02-16 LAB — HEMOGLOBIN A1C
Est. average glucose Bld gHb Est-mCnc: 134 mg/dL
Hgb A1c MFr Bld: 6.3 % — ABNORMAL HIGH (ref 4.8–5.6)

## 2019-02-16 LAB — NOVEL CORONAVIRUS, NAA: SARS-CoV-2, NAA: NOT DETECTED

## 2019-06-07 ENCOUNTER — Encounter: Payer: Self-pay | Admitting: Nurse Practitioner

## 2019-06-07 ENCOUNTER — Ambulatory Visit: Payer: 59 | Admitting: Nurse Practitioner

## 2019-06-07 ENCOUNTER — Other Ambulatory Visit: Payer: Self-pay

## 2019-06-07 VITALS — BP 122/78 | HR 73 | Temp 97.7°F | Ht 69.8 in | Wt 208.0 lb

## 2019-06-07 DIAGNOSIS — Z125 Encounter for screening for malignant neoplasm of prostate: Secondary | ICD-10-CM | POA: Diagnosis not present

## 2019-06-07 DIAGNOSIS — Z0000 Encounter for general adult medical examination without abnormal findings: Secondary | ICD-10-CM | POA: Diagnosis not present

## 2019-06-07 DIAGNOSIS — R7309 Other abnormal glucose: Secondary | ICD-10-CM | POA: Diagnosis not present

## 2019-06-07 DIAGNOSIS — E559 Vitamin D deficiency, unspecified: Secondary | ICD-10-CM | POA: Diagnosis not present

## 2019-06-07 LAB — POCT URINALYSIS DIPSTICK
Bilirubin, UA: NEGATIVE
Blood, UA: NEGATIVE
Glucose, UA: NEGATIVE
Ketones, UA: NEGATIVE
Leukocytes, UA: NEGATIVE
Nitrite, UA: NEGATIVE
Protein, UA: POSITIVE — AB
Spec Grav, UA: >=1.03 — AB (ref 1.010–1.025)
Urobilinogen, UA: 0.2 E.U./dL
pH, UA: 6 (ref 5.0–8.0)

## 2019-06-07 LAB — POCT UA - MICROALBUMIN
Creatinine, POC: 300 mg/dL
Microalbumin Ur, POC: 150 mg/L

## 2019-06-07 NOTE — Patient Instructions (Signed)

## 2019-06-07 NOTE — Progress Notes (Signed)
Subjective:     Patient ID: Corey Ayala , male    DOB: February 19, 1965 , 54 y.o.   MRN: [id number]   Chief Complaint  Patient presents with  . Annual Exam   Men's preventive visit. Patient Health Questionnaire (PHQ-2) is    Office Visit from 02/15/2019 in [facility]  PHQ-2 Total Score  0     Patient is on a regular diet, low carbohydrate, limited sweets and fast food. Marital status: Married. Relevant history for alcohol use is:   HPI Here is here for his HM visit. Had been having knee pain after running will use an herb to apply to the skin with relief, does not feel bad enough for any additional intervention  CDC information on the covid vaccine, he is hesitant about the vaccine.  Exercising by running regularly.   Social History   Substance and Sexual Activity  Alcohol Use No   Relevant history for tobacco use is:  Social History   Tobacco Use  Smoking Status Never Smoker  Smokeless Tobacco Never Used     History reviewed. No pertinent past medical history.   Family History  Problem Relation Age of Onset  . Diabetes Mother   . Hypertension Mother   . Arthritis Mother      Current Outpatient Medications:  Marland Kitchen  Multiple Vitamin (MULTIVITAMIN) tablet, Take 1 tablet by mouth daily., Disp: , Rfl:  .  UNABLE TO FIND, [place]aga Daily, Disp: , Rfl:    Allergies  Allergen Reactions  . Penicillins Swelling and Rash    Has patient had a PCN reaction causing immediate rash, facial/tongue/throat swelling, SOB or lightheadedness with hypotension: yes Has patient had a PCN reaction causing severe rash involving mucus membranes or skin necrosis: unknown Has patient had a PCN reaction that required hospitalization: yes office visit If all of the above answers are "NO", then may proceed with Cephalosporin use. Has patient had a PCN reaction occurring within the last 10 years: no     Review of Systems  Constitutional: Negative.   HENT: Negative.   Eyes:  Negative.   Respiratory: Negative.   Cardiovascular: Negative.   Gastrointestinal: Negative.   Endocrine: Negative.   Genitourinary: Negative.   Musculoskeletal: Positive for arthralgias (Right Knee Pain 3/10).  Allergic/Immunologic: Negative.   Neurological: Negative.   Hematological: Negative.   Psychiatric/Behavioral: Negative.      Today's Vitals   06/07/19 0958  BP: 122/78  Pulse: 73  Temp: 97.7 F (36.5 C)  TempSrc: Oral  Weight: 208 lb (94.3 kg)  Height: 5' 9.8" (1.773 m)  PainSc: 4   PainLoc: Knee   Body mass index is 30.02 kg/m.   Objective:  Physical Exam  Constitutional: He is oriented to person, place, and time. He appears well-developed and well-nourished. He does not appear ill.  HENT:  Head: Normocephalic and atraumatic.  Right Ear: Tympanic membrane, external ear and ear canal normal.  Left Ear: Tympanic membrane, external ear and ear canal normal.  Eyes: Pupils are equal, round, and reactive to light. Conjunctivae, EOM and lids are normal.  Cardiovascular: Normal rate, regular rhythm, normal heart sounds, intact distal pulses and normal pulses.  No murmur heard. Pulses:      Carotid pulses are 2+ on the right side and 2+ on the left side.      Radial pulses are 2+ on the right side and 2+ on the left side.       Femoral pulses are 2+ on the right  side and 2+ on the left side.      Popliteal pulses are 2+ on the right side and 2+ on the left side.       Dorsalis pedis pulses are 2+ on the right side and 2+ on the left side.       Posterior tibial pulses are 2+ on the right side and 2+ on the left side.  Pulmonary/Chest: Effort normal and breath sounds normal.  Abdominal: Soft. Normal appearance and bowel sounds are normal. He exhibits no distension.  Genitourinary:    Prostate normal.   Musculoskeletal:        General: No tenderness. Normal range of motion.     Cervical back: Normal range of motion and neck supple. Muscular tenderness (left neck  tension) present.  Lymphadenopathy:    He has no cervical adenopathy.  Neurological: He is alert and oriented to person, place, and time. No cranial nerve deficit.  Skin: Skin is warm and dry.  Psychiatric: His behavior is normal. Judgment and thought content normal.        Assessment And Plan:     1. Encounter for general adult medical examination w/o abnormal findings . Behavior modifications discussed and diet history reviewed.   . Pt will continue to exercise regularly and modify diet with low GI, plant based foods and decrease intake of processed foods.  . Recommend intake of daily multivitamin, Vitamin D, and calcium.  . Recommend for preventive screenings, as well as recommend immunizations that include influenza, TDAP (up to date) - POCT Urinalysis Dipstick (81002) - CBC no Diff - CMP14 + Anion Gap  2. Encounter for prostate cancer screening  - PSA  3. Abnormal glucose  Chronic, slightly elevated  Advised to increase physical activity and to consider taking benefiber daily to help with glucose control.   - POCT Urinalysis Dipstick (81002) - POCT UA - Microalbumin - Lipid panel - CMP14+EGFR - Hemoglobin A1c  4. Vitamin D deficiency  Will check vitamin D level and supplement as needed.     Also encouraged to spend 15 minutes in the sun daily.  - VITAMIN D 25 Hydroxy (Vit-D Deficiency, Fractures)     Minette Brine, FNP    THE PATIENT IS ENCOURAGED TO PRACTICE SOCIAL DISTANCING DUE TO THE COVID-19 PANDEMIC.

## 2019-06-07 NOTE — Progress Notes (Signed)
Subjective

## 2019-06-08 LAB — CMP14+EGFR
ALT: 19 IU/L (ref 0–44)
AST: 23 IU/L (ref 0–40)
Albumin/Globulin Ratio: 1.5 (ref 1.2–2.2)
Albumin: 4.3 g/dL (ref 3.8–4.9)
Alkaline Phosphatase: 86 IU/L (ref 48–121)
BUN/Creatinine Ratio: 13 (ref 9–20)
BUN: 18 mg/dL (ref 6–24)
Bilirubin Total: 0.6 mg/dL (ref 0.0–1.2)
CO2: 25 mmol/L (ref 20–29)
Calcium: 9.6 mg/dL (ref 8.7–10.2)
Chloride: 100 mmol/L (ref 96–106)
Creatinine, Ser: 1.4 mg/dL — ABNORMAL HIGH (ref 0.76–1.27)
GFR calc Af Amer: 64 mL/min/{1.73_m2} (ref >59)
GFR calc non Af Amer: 56 mL/min/{1.73_m2} — ABNORMAL LOW (ref >59)
Globulin, Total: 2.9 g/dL (ref 1.5–4.5)
Glucose: 102 mg/dL — ABNORMAL HIGH (ref 65–99)
Potassium: 4.6 mmol/L (ref 3.5–5.2)
Sodium: 140 mmol/L (ref 134–144)
Total Protein: 7.2 g/dL (ref 6.0–8.5)

## 2019-06-08 LAB — CBC
Hematocrit: 45.9 % (ref 37.5–51.0)
Hemoglobin: 15.8 g/dL (ref 13.0–17.7)
MCH: 29.5 pg (ref 26.6–33.0)
MCHC: 34.4 g/dL (ref 31.5–35.7)
MCV: 86 fL (ref 79–97)
Platelets: 316 10*3/uL (ref 150–450)
RBC: 5.36 x10E6/uL (ref 4.14–5.80)
RDW: 12.2 % (ref 11.6–15.4)
WBC: 4.4 10*3/uL (ref 3.4–10.8)

## 2019-06-08 LAB — LIPID PANEL
Chol/HDL Ratio: 3.5 ratio (ref 0.0–5.0)
Cholesterol, Total: 222 mg/dL — ABNORMAL HIGH (ref 100–199)
HDL: 64 mg/dL (ref >39)
LDL Chol Calc (NIH): 142 mg/dL — ABNORMAL HIGH (ref 0–99)
Triglycerides: 93 mg/dL (ref 0–149)
VLDL Cholesterol Cal: 16 mg/dL (ref 5–40)

## 2019-06-08 LAB — HEMOGLOBIN A1C
Est. average glucose Bld gHb Est-mCnc: 134 mg/dL
Hgb A1c MFr Bld: 6.3 % — ABNORMAL HIGH (ref 4.8–5.6)

## 2019-06-08 LAB — VITAMIN D 25 HYDROXY (VIT D DEFICIENCY, FRACTURES): Vit D, 25-Hydroxy: 75.7 ng/mL (ref 30.0–100.0)

## 2019-06-08 LAB — PSA: Prostate Specific Ag, Serum: 2.1 ng/mL (ref 0.0–4.0)

## 2019-10-11 ENCOUNTER — Other Ambulatory Visit: Payer: Self-pay

## 2019-10-11 ENCOUNTER — Ambulatory Visit: Payer: 59 | Admitting: Nurse Practitioner

## 2019-10-11 ENCOUNTER — Encounter: Payer: Self-pay | Admitting: Nurse Practitioner

## 2019-10-11 VITALS — BP 126/82 | HR 68 | Temp 97.9°F | Ht 69.8 in | Wt 213.6 lb

## 2019-10-11 DIAGNOSIS — R7309 Other abnormal glucose: Secondary | ICD-10-CM | POA: Diagnosis not present

## 2019-10-11 NOTE — Patient Instructions (Addendum)
Increase your intake of fiber

## 2019-10-11 NOTE — Progress Notes (Signed)
I,Yamilka Roman [employer] as a Neurosurgeon for [employer], FNP.,have documented all relevant documentation on the behalf of [name], FNP,as directed by  [name], FNP while in the presence of [name], FNP.  This visit occurred during the SARS-CoV-2 public health emergency.  Safety protocols were in place, including screening questions prior to the visit, additional usage of staff PPE, and extensive cleaning of exam room while observing appropriate contact time as indicated for disinfecting solutions.  Subjective:     Patient ID: Corey Ayala , male    DOB: 1965/02/18 , 54 y.o.   MRN: [id number]   Chief Complaint  Patient presents with  . abnormal glucose    patient is here for a 4 month f/u    HPI  Here for recheck abnormal glucose.  Continues to exercise regularly.  Does admit to not eating well with having a new.       History reviewed. No pertinent past medical history.   Family History  Problem Relation Age of Onset  . Diabetes Mother   . Hypertension Mother   . Arthritis Mother      Current Outpatient Medications:  Marland Kitchen  Multiple Vitamin (MULTIVITAMIN) tablet, Take 1 tablet by mouth daily., Disp: , Rfl:  .  UNABLE TO FIND, Chaga Daily, Disp: , Rfl:    Allergies  Allergen Reactions  . Penicillins Swelling and Rash    Has patient had a PCN reaction causing immediate rash, facial/tongue/throat swelling, SOB or lightheadedness with hypotension: yes Has patient had a PCN reaction causing severe rash involving mucus membranes or skin necrosis: unknown Has patient had a PCN reaction that required hospitalization: yes office visit If all of the above answers are "NO", then may proceed with Cephalosporin use. Has patient had a PCN reaction occurring within the last 10 years: no     Review of Systems  Constitutional: Negative.   Respiratory: Negative.  Negative for cough.   Cardiovascular: Negative.  Negative for chest pain, palpitations and leg swelling.   Neurological: Negative for dizziness and headaches.  Psychiatric/Behavioral: Negative.      Today's Vitals   10/11/19 1003  BP: 126/82  Pulse: 68  Temp: 97.9 F (36.6 C)  Weight: 213 lb 9.6 oz (96.9 kg)  Height: 5' 9.8" (1.773 m)  PainSc: 0-No pain   Body mass index is 30.82 kg/m.   Objective:  Physical Exam Vitals reviewed.  Constitutional:      General: He is not in acute distress.    Appearance: Normal appearance.  Cardiovascular:     Rate and Rhythm: Normal rate and regular rhythm.     Pulses: Normal pulses.     Heart sounds: No murmur heard.   Pulmonary:     Effort: Pulmonary effort is normal. No respiratory distress.     Breath sounds: Normal breath sounds.  Skin:    Capillary Refill: Capillary refill takes less than 2 seconds.  Neurological:     General: No focal deficit present.     Mental Status: He is alert and oriented to person, place, and time.     Cranial Nerves: No cranial nerve deficit.  Psychiatric:        Mood and Affect: Mood normal.        Behavior: Behavior normal.        Thought Content: Thought content normal.        Judgment: Judgment normal.         Assessment And Plan:     1. Abnormal glucose  Encouraged strongly to monitor his intake of sugary foods and starches (carbohydrates).  Continue exercising regularly. Increase fiber intake     Patient was given opportunity to ask questions. Patient verbalized understanding of the plan and was able to repeat key elements of the plan. All questions were answered to their satisfaction.   Jeanell Sparrow, FNP, have reviewed all documentation for this visit. The documentation on 10/11/19 for the exam, diagnosis, procedures, and orders are all accurate and complete.   THE PATIENT IS ENCOURAGED TO PRACTICE SOCIAL DISTANCING DUE TO THE COVID-19 PANDEMIC.

## 2020-01-03 ENCOUNTER — Encounter: Payer: Self-pay | Admitting: Nurse Practitioner

## 2020-01-03 ENCOUNTER — Ambulatory Visit: Payer: 59 | Admitting: Nurse Practitioner

## 2020-01-03 ENCOUNTER — Other Ambulatory Visit: Payer: Self-pay

## 2020-01-03 VITALS — BP 122/80 | HR 73 | Temp 97.9°F | Ht 70.0 in | Wt 212.8 lb

## 2020-01-03 DIAGNOSIS — R109 Unspecified abdominal pain: Secondary | ICD-10-CM | POA: Diagnosis not present

## 2020-01-03 DIAGNOSIS — R7309 Other abnormal glucose: Secondary | ICD-10-CM | POA: Diagnosis not present

## 2020-01-03 DIAGNOSIS — R808 Other proteinuria: Secondary | ICD-10-CM

## 2020-01-03 LAB — POCT URINALYSIS DIPSTICK
Bilirubin, UA: NEGATIVE
Blood, UA: NEGATIVE
Glucose, UA: NEGATIVE
Ketones, UA: NEGATIVE
Leukocytes, UA: NEGATIVE
Nitrite, UA: NEGATIVE
Protein, UA: POSITIVE — AB
Spec Grav, UA: >=1.03 — AB (ref 1.010–1.025)
Urobilinogen, UA: 0.2 E.U./dL
pH, UA: 5.5 (ref 5.0–8.0)

## 2020-01-03 NOTE — Progress Notes (Signed)
I,Yamilka Roman [employer] as a Neurosurgeon for [employer], FNP.,have documented all relevant documentation on the behalf of [name], FNP,as directed by  [name], FNP while in the presence of [name], FNP. This visit occurred during the SARS-CoV-2 public health emergency.  Safety protocols were in place, including screening questions prior to the visit, additional usage of staff PPE, and extensive cleaning of exam room while observing appropriate contact time as indicated for disinfecting solutions.  Subjective:     Patient ID: Corey Ayala , male    DOB: Jan 19, 1966 , 54 y.o.   MRN: [id number]   Chief Complaint  Patient presents with  . abnormal glucose    HPI  Here for 3 month follow up for his abnormal glucose.  He has cut back on sweet tea.  Continues to exercise but is a little less due to having a pain to his left flank area (eased up on running) has been walking with his wife.     History reviewed. No pertinent past medical history.   Family History  Problem Relation Age of Onset  . Diabetes Mother   . Hypertension Mother   . Arthritis Mother      Current Outpatient Medications:  Marland Kitchen  Multiple Vitamin (MULTIVITAMIN) tablet, Take 1 tablet by mouth daily., Disp: , Rfl:  .  UNABLE TO FIND, Chaga Daily, Disp: , Rfl:  .  UNABLE TO FIND, Ganoderma lucidum: take one capsule daily, Disp: , Rfl:    Allergies  Allergen Reactions  . Penicillins Swelling and Rash    Has patient had a PCN reaction causing immediate rash, facial/tongue/throat swelling, SOB or lightheadedness with hypotension: yes Has patient had a PCN reaction causing severe rash involving mucus membranes or skin necrosis: unknown Has patient had a PCN reaction that required hospitalization: yes office visit If all of the above answers are "NO", then may proceed with Cephalosporin use. Has patient had a PCN reaction occurring within the last 10 years: no     Review of Systems  Constitutional:  Negative.   Respiratory: Negative.   Cardiovascular: Negative.  Negative for chest pain, palpitations and leg swelling.  Musculoskeletal: Positive for back pain (flank tenderness, had slept on a bed that was not as comfortable. improved now. Has used a CBD rub with menthol).  Neurological: Negative for dizziness.  Psychiatric/Behavioral: Negative.      Today's Vitals   01/03/20 1040  BP: 122/80  Pulse: 73  Temp: 97.9 F (36.6 C)  TempSrc: Oral  Weight: 212 lb 12.8 oz (96.5 kg)  Height: 5\' 10"  (1.778 m)  PainSc: 3   PainLoc: Hip   Body mass index is 30.53 kg/m.   Objective:  Physical Exam Vitals reviewed.  Constitutional:      General: He is not in acute distress.    Appearance: Normal appearance. He is obese.  Cardiovascular:     Rate and Rhythm: Normal rate and regular rhythm.     Pulses: Normal pulses.     Heart sounds: Normal heart sounds. No murmur heard.   Pulmonary:     Effort: Pulmonary effort is normal. No respiratory distress.     Breath sounds: Normal breath sounds. No wheezing.  Skin:    Capillary Refill: Capillary refill takes less than 2 seconds.  Neurological:     General: No focal deficit present.     Mental Status: He is alert and oriented to person, place, and time.     Cranial Nerves: No cranial nerve deficit.  Psychiatric:  Mood and Affect: Mood normal.        Behavior: Behavior normal.        Thought Content: Thought content normal.        Judgment: Judgment normal.         Assessment And Plan:     1. Abnormal glucose  Chronic, was slightly up at last visit   He has made changes to his diet   Will recheck HgbA1c - Hemoglobin A1c  2. Flank pain, acute  Negative CVA tenderness  Negative urinalysis but does have protein the last couple times he had urine done - POCT Urinalysis Dipstick (20254) - US Renal; Future  3. Other proteinuria  Will check renal ultrasound - US Renal; Future     Patient was given opportunity  to ask questions. Patient verbalized understanding of the plan and was able to repeat key elements of the plan. All questions were answered to their satisfaction.   Jeanell Sparrow, FNP, have reviewed all documentation for this visit. The documentation on 01/09/20 for the exam, diagnosis, procedures, and orders are all accurate and complete.  THE PATIENT IS ENCOURAGED TO PRACTICE SOCIAL DISTANCING DUE TO THE COVID-19 PANDEMIC.

## 2020-01-04 LAB — HEMOGLOBIN A1C
Est. average glucose Bld gHb Est-mCnc: 114 mg/dL
Hgb A1c MFr Bld: 5.6 % (ref 4.8–5.6)

## 2020-01-25 ENCOUNTER — Ambulatory Visit
Admission: RE | Admit: 2020-01-25 | Discharge: 2020-01-25 | Disposition: A | Discharge status: Home / Self Care | Payer: 59 | Source: Ambulatory Visit | Attending: Nurse Practitioner | Admitting: Nurse Practitioner

## 2020-01-25 DIAGNOSIS — R109 Unspecified abdominal pain: Secondary | ICD-10-CM

## 2020-01-25 DIAGNOSIS — R808 Other proteinuria: Secondary | ICD-10-CM

## 2020-02-08 ENCOUNTER — Other Ambulatory Visit: Payer: Self-pay | Admitting: Nurse Practitioner

## 2020-02-08 DIAGNOSIS — R109 Unspecified abdominal pain: Secondary | ICD-10-CM

## 2020-02-24 ENCOUNTER — Ambulatory Visit
Admission: RE | Admit: 2020-02-24 | Discharge: 2020-02-24 | Disposition: A | Discharge status: Home / Self Care | Payer: 59 | Source: Ambulatory Visit | Attending: Nurse Practitioner | Admitting: Nurse Practitioner

## 2020-02-24 ENCOUNTER — Other Ambulatory Visit: Payer: Self-pay

## 2020-02-24 DIAGNOSIS — R109 Unspecified abdominal pain: Secondary | ICD-10-CM

## 2020-02-24 MED ORDER — IOPAMIDOL (ISOVUE-300) INJECTION 61%
100.0000 mL | Freq: Once | INTRAVENOUS | Status: AC | PRN
  Administered 2020-02-24: 100 mL via INTRAVENOUS

## 2020-03-13 NOTE — Progress Notes (Signed)
I would limit peanuts this can sometimes flare diverticulosis

## 2020-06-12 ENCOUNTER — Other Ambulatory Visit: Payer: Self-pay

## 2020-06-12 ENCOUNTER — Encounter: Payer: Self-pay | Admitting: Nurse Practitioner

## 2020-06-12 ENCOUNTER — Ambulatory Visit (INDEPENDENT_AMBULATORY_CARE_PROVIDER_SITE_OTHER): Payer: 59 | Admitting: Nurse Practitioner

## 2020-06-12 VITALS — BP 114/70 | HR 68 | Temp 97.6°F | Ht 70.0 in | Wt 214.4 lb

## 2020-06-12 DIAGNOSIS — Z0000 Encounter for general adult medical examination without abnormal findings: Secondary | ICD-10-CM

## 2020-06-12 DIAGNOSIS — E6609 Other obesity due to excess calories: Secondary | ICD-10-CM | POA: Diagnosis not present

## 2020-06-12 DIAGNOSIS — R7309 Other abnormal glucose: Secondary | ICD-10-CM | POA: Diagnosis not present

## 2020-06-12 DIAGNOSIS — Z125 Encounter for screening for malignant neoplasm of prostate: Secondary | ICD-10-CM

## 2020-06-12 DIAGNOSIS — E7800 Pure hypercholesterolemia, unspecified: Secondary | ICD-10-CM

## 2020-06-12 DIAGNOSIS — R808 Other proteinuria: Secondary | ICD-10-CM

## 2020-06-12 DIAGNOSIS — E559 Vitamin D deficiency, unspecified: Secondary | ICD-10-CM | POA: Diagnosis not present

## 2020-06-12 DIAGNOSIS — Z6830 Body mass index (BMI) 30.0-30.9, adult: Secondary | ICD-10-CM

## 2020-06-12 LAB — POCT URINALYSIS DIPSTICK
Bilirubin, UA: NEGATIVE
Blood, UA: NEGATIVE
Glucose, UA: NEGATIVE
Ketones, UA: NEGATIVE
Leukocytes, UA: NEGATIVE
Nitrite, UA: NEGATIVE
Protein, UA: POSITIVE — AB
Spec Grav, UA: >=1.03 — AB (ref 1.010–1.025)
Urobilinogen, UA: 0.2 E.U./dL
pH, UA: 5.5 (ref 5.0–8.0)

## 2020-06-12 NOTE — Patient Instructions (Signed)

## 2020-06-12 NOTE — Progress Notes (Signed)
I,Yamilka Roman [employer] as a Education administrator for [employer], FNP.,have documented all relevant documentation on the behalf of Minette Brine, FNP,as directed by  Minette Brine, FNP while in the presence of Minette Brine, [place]. This visit occurred during the SARS-CoV-2 public health emergency.  Safety protocols were in place, including screening questions prior to the visit, additional usage of staff PPE, and extensive cleaning of exam room while observing appropriate contact time as indicated for disinfecting solutions.  Subjective:     Patient ID: Corey Ayala , male    DOB: 09-Mar-1965 , 55 y.o.   MRN: [id number]   Chief Complaint  Patient presents with   Annual Exam    HPI  Patient here for hm.    Wt Readings from Last 3 Encounters: 06/12/20 : 214 lb 6.4 oz (97.3 kg) 01/03/20 : 212 lb 12.8 oz (96.5 kg) 10/11/19 : 213 lb 9.6 oz (96.9 kg)  His scale at home is 208 without any clothes.    History reviewed. No pertinent past medical history.   Family History  Problem Relation Age of Onset   Diabetes Mother    Hypertension Mother    Arthritis Mother      Current Outpatient Medications:    Multiple Vitamin (MULTIVITAMIN) tablet, Take 1 tablet by mouth daily., Disp: , Rfl:    UNABLE TO FIND, Chaga Daily, Disp: , Rfl:    UNABLE TO FIND, Ganoderma lucidum: take one capsule daily, Disp: , Rfl:    Allergies  Allergen Reactions   Penicillins Swelling and Rash    Has patient had a PCN reaction causing immediate rash, facial/tongue/throat swelling, SOB or lightheadedness with hypotension: yes Has patient had a PCN reaction causing severe rash involving mucus membranes or skin necrosis: unknown Has patient had a PCN reaction that required hospitalization: yes office visit If all of the above answers are "NO", then may proceed with Cephalosporin use. Has patient had a PCN reaction occurring within the last 10 years: no     Men's preventive visit. Patient Health Questionnaire (PHQ-2)  is  [place] Office Visit from 06/12/2020 in [facility]  PHQ-2 Total Score 0      Patient is on a Regular diet; admits to not eating as healthy during the time his son graduated from college. He is staying away from raw and no sugar. Limits salt intake. Exercising 2-3 times a week. He is running at least 2 times a week. Marital status: Married. Relevant history for alcohol use is:  Social History   Substance and Sexual Activity  Alcohol Use No   Relevant history for tobacco use is:  Social History   Tobacco Use  Smoking Status Never  Smokeless Tobacco Never  .   Review of Systems  Constitutional: Negative.   HENT: Negative.    Eyes: Negative.   Respiratory: Negative.  Negative for shortness of breath and wheezing.   Cardiovascular: Negative.   Gastrointestinal: Negative.   Genitourinary: Negative.   Musculoskeletal: Negative.   Skin: Negative.   Neurological: Negative.   Hematological: Negative.   Psychiatric/Behavioral: Negative.      Today's Vitals   06/12/20 1000  BP: 114/70  Pulse: 68  Temp: 97.6 F (36.4 C)  Weight: 214 lb 6.4 oz (97.3 kg)  Height: 5' 10"  (1.778 m)  PainSc: 0-No pain   Body mass index is 30.76 kg/m.   Objective:  Physical Exam Vitals reviewed.  Constitutional:      General: He is not in acute distress.  Appearance: Normal appearance. He is obese.  HENT:     Head: Normocephalic and atraumatic.     Right Ear: Tympanic membrane, ear canal and external ear normal. There is no impacted cerumen.     Left Ear: Tympanic membrane, ear canal and external ear normal. There is no impacted cerumen.     Nose:     Comments: Deferred - masked    Mouth/Throat:     Comments: Deferred - masked Eyes:     Extraocular Movements: Extraocular movements intact.     Pupils: Pupils are equal, round, and reactive to light.  Cardiovascular:     Rate and Rhythm: Normal rate and regular rhythm.     Pulses: Normal pulses.      Heart sounds: Normal heart sounds. No murmur heard. Pulmonary:     Effort: Pulmonary effort is normal. No respiratory distress.     Breath sounds: Normal breath sounds. No wheezing.  Abdominal:     General: Abdomen is flat. Bowel sounds are normal. There is no distension.     Palpations: Abdomen is soft.  Genitourinary:    Prostate: Normal.     Rectum: Guaiac result negative (negative).  Musculoskeletal:        General: No swelling or tenderness. Normal range of motion.     Cervical back: Normal range of motion and neck supple.  Skin:    General: Skin is warm.     Capillary Refill: Capillary refill takes less than 2 seconds.  Neurological:     General: No focal deficit present.     Mental Status: He is alert and oriented to person, place, and time.     Cranial Nerves: No cranial nerve deficit.  Psychiatric:        Mood and Affect: Mood normal.        Behavior: Behavior normal.        Thought Content: Thought content normal.        Judgment: Judgment normal.        Assessment And Plan:    1. Encounter for general adult medical examination w/o abnormal findings Behavior modifications discussed and diet history reviewed.   Pt will continue to exercise regularly and modify diet with low GI, plant based foods and decrease intake of processed foods.  Recommend intake of daily multivitamin, Vitamin D, and calcium.  Recommend colonoscopy for preventive screenings, as well as recommend immunizations that include influenza, TDAP  2. Encounter for prostate cancer screening - PSA  3. Class 1 obesity due to excess calories with body mass index (BMI) of 30.0 to 30.9 in adult, unspecified whether serious comorbidity present Chronic Discussed healthy diet and regular exercise options  Encouraged to exercise at least 150 minutes per week with 2 days of strength training  4. Abnormal glucose Comments: encouraged to limit intake of sugary foods HgbA1c is stable - CMP14+EGFR - CBC no  Diff - Hemoglobin A1c  5. Vitamin D deficiency Will check vitamin D level and supplement as needed.    Also encouraged to spend 15 minutes in the sun daily.   6. Elevated cholesterol Comments: will check lipid panel encouraged to limit intake of fried and fatty foods - Lipid panel  7. Other proteinuria Comments: if continues to have protein in urine will check renal ultrasound - POCT Urinalysis Dipstick (11914)     Patient was given opportunity to ask questions. Patient verbalized understanding of the plan and was able to repeat key elements of the plan. All questions were answered  to their satisfaction.   Minette Brine, FNP   I, Minette Brine, FNP, have reviewed all documentation for this visit. The documentation on 05/2320 for the exam, diagnosis, procedures, and orders are all accurate and complete.   THE PATIENT IS ENCOURAGED TO PRACTICE SOCIAL DISTANCING DUE TO THE COVID-19 PANDEMIC.

## 2020-06-13 LAB — CBC
Hematocrit: 42.7 % (ref 37.5–51.0)
Hemoglobin: 14.5 g/dL (ref 13.0–17.7)
MCH: 29.2 pg (ref 26.6–33.0)
MCHC: 34 g/dL (ref 31.5–35.7)
MCV: 86 fL (ref 79–97)
Platelets: 286 10*3/uL (ref 150–450)
RBC: 4.96 x10E6/uL (ref 4.14–5.80)
RDW: 12.1 % (ref 11.6–15.4)
WBC: 4.1 10*3/uL (ref 3.4–10.8)

## 2020-06-13 LAB — CMP14+EGFR
ALT: 16 IU/L (ref 0–44)
AST: 19 IU/L (ref 0–40)
Albumin/Globulin Ratio: 1.6 (ref 1.2–2.2)
Albumin: 4.2 g/dL (ref 3.8–4.9)
Alkaline Phosphatase: 97 IU/L (ref 44–121)
BUN/Creatinine Ratio: 14 (ref 9–20)
BUN: 17 mg/dL (ref 6–24)
Bilirubin Total: 0.3 mg/dL (ref 0.0–1.2)
CO2: 22 mmol/L (ref 20–29)
Calcium: 9.3 mg/dL (ref 8.7–10.2)
Chloride: 106 mmol/L (ref 96–106)
Creatinine, Ser: 1.21 mg/dL (ref 0.76–1.27)
Globulin, Total: 2.6 g/dL (ref 1.5–4.5)
Glucose: 108 mg/dL — ABNORMAL HIGH (ref 65–99)
Potassium: 4.8 mmol/L (ref 3.5–5.2)
Sodium: 142 mmol/L (ref 134–144)
Total Protein: 6.8 g/dL (ref 6.0–8.5)
eGFR: 70 mL/min/{1.73_m2} (ref >59)

## 2020-06-13 LAB — HEMOGLOBIN A1C
Est. average glucose Bld gHb Est-mCnc: 137 mg/dL
Hgb A1c MFr Bld: 6.4 % — ABNORMAL HIGH (ref 4.8–5.6)

## 2020-06-13 LAB — LIPID PANEL
Chol/HDL Ratio: 2.8 ratio (ref 0.0–5.0)
Cholesterol, Total: 190 mg/dL (ref 100–199)
HDL: 68 mg/dL (ref >39)
LDL Chol Calc (NIH): 110 mg/dL — ABNORMAL HIGH (ref 0–99)
Triglycerides: 63 mg/dL (ref 0–149)
VLDL Cholesterol Cal: 12 mg/dL (ref 5–40)

## 2020-06-13 LAB — PSA: Prostate Specific Ag, Serum: 2.1 ng/mL (ref 0.0–4.0)

## 2020-10-23 ENCOUNTER — Encounter: Payer: Self-pay | Admitting: Nurse Practitioner

## 2020-10-23 ENCOUNTER — Other Ambulatory Visit: Payer: Self-pay

## 2020-10-23 ENCOUNTER — Ambulatory Visit: Payer: 59 | Admitting: Nurse Practitioner

## 2020-10-23 VITALS — BP 114/82 | HR 69 | Temp 97.9°F | Ht 70.0 in | Wt 209.2 lb

## 2020-10-23 DIAGNOSIS — E7800 Pure hypercholesterolemia, unspecified: Secondary | ICD-10-CM

## 2020-10-23 DIAGNOSIS — E559 Vitamin D deficiency, unspecified: Secondary | ICD-10-CM | POA: Diagnosis not present

## 2020-10-23 DIAGNOSIS — R7309 Other abnormal glucose: Secondary | ICD-10-CM | POA: Diagnosis not present

## 2020-10-23 DIAGNOSIS — E6609 Other obesity due to excess calories: Secondary | ICD-10-CM

## 2020-10-23 DIAGNOSIS — Z6830 Body mass index (BMI) 30.0-30.9, adult: Secondary | ICD-10-CM

## 2020-10-23 NOTE — Progress Notes (Signed)
I,Katawbba Wiggins,acting as a Education administrator for [employer], FNP.,have documented all relevant documentation on the behalf of Minette Brine, FNP,as directed by  Minette Brine, FNP while in the presence of Minette Brine, [place].   This visit occurred during the SARS-CoV-2 public health emergency.  Safety protocols were in place, including screening questions prior to the visit, additional usage of staff PPE, and extensive cleaning of exam room while observing appropriate contact time as indicated for disinfecting solutions.  Subjective:     Patient ID: Corey Ayala , male    DOB: May 09, 1965 , 55 y.o.   MRN: [id number]   Chief Complaint  Patient presents with   Prediabetes   Hyperlipidemia    HPI  Here for 3 month follow up for his abnormal glucose and cholesterol f/u.  Does admit to eating bad foods while out of town. He did have covid in August  Wt Readings from Last 3 Encounters: 10/23/20 : 209 lb 3.2 oz (94.9 kg) 06/12/20 : 214 lb 6.4 oz (97.3 kg) 01/03/20 : 212 lb 12.8 oz (96.5 kg)      History reviewed. No pertinent past medical history.   Family History  Problem Relation Age of Onset   Diabetes Mother    Hypertension Mother    Arthritis Mother      Current Outpatient Medications:    Multiple Vitamin (MULTIVITAMIN) tablet, Take 1 tablet by mouth daily., Disp: , Rfl:    UNABLE TO FIND, Chaga Daily, Disp: , Rfl:    UNABLE TO FIND, Ganoderma lucidum: take one capsule daily, Disp: , Rfl:    Allergies  Allergen Reactions   Penicillins Swelling and Rash    Has patient had a PCN reaction causing immediate rash, facial/tongue/throat swelling, SOB or lightheadedness with hypotension: yes Has patient had a PCN reaction causing severe rash involving mucus membranes or skin necrosis: unknown Has patient had a PCN reaction that required hospitalization: yes office visit If all of the above answers are "NO", then may proceed with Cephalosporin use. Has patient had a PCN reaction  occurring within the last 10 years: no     Review of Systems  Constitutional: Negative.   HENT: Negative.    Respiratory: Negative.    Cardiovascular: Negative.   Gastrointestinal: Negative.   Neurological: Negative.  Negative for dizziness and headaches.  Psychiatric/Behavioral: Negative.    All other systems reviewed and are negative.   Today's Vitals   10/23/20 1001  BP: 114/82  Pulse: 69  Temp: 97.9 F (36.6 C)  Weight: 209 lb 3.2 oz (94.9 kg)  Height: 5' 10" (1.778 m)   Body mass index is 30.02 kg/m.  Wt Readings from Last 3 Encounters:  10/23/20 209 lb 3.2 oz (94.9 kg)  06/12/20 214 lb 6.4 oz (97.3 kg)  01/03/20 212 lb 12.8 oz (96.5 kg)    BP Readings from Last 3 Encounters:  10/23/20 114/82  06/12/20 114/70  01/03/20 122/80    Objective:  Physical Exam Vitals reviewed.  Constitutional:      General: He is not in acute distress.    Appearance: Normal appearance.  Cardiovascular:     Rate and Rhythm: Normal rate and regular rhythm.     Pulses: Normal pulses.     Heart sounds: Normal heart sounds. No murmur heard. Pulmonary:     Effort: Pulmonary effort is normal. No respiratory distress.     Breath sounds: Normal breath sounds. No wheezing.  Skin:    General: Skin is warm and dry.  Capillary Refill: Capillary refill takes less than 2 seconds.  Neurological:     General: No focal deficit present.     Mental Status: He is alert and oriented to person, place, and time.     Cranial Nerves: No cranial nerve deficit.     Motor: No weakness.  Psychiatric:        Mood and Affect: Mood normal.        Behavior: Behavior normal.        Thought Content: Thought content normal.        Assessment And Plan:     1. Abnormal glucose Chronic, stable.  Continue with current medications Encouraged to limit intake of sugary foods and drinks Encouraged to continue with his physical activity to 150 minutes per week - BMP8+EGFR - Hemoglobin A1c  2. Vitamin D  deficiency Will check vitamin D level and supplement as needed.    Also encouraged to spend 15 minutes in the sun daily.  - VITAMIN D 25 Hydroxy (Vit-D Deficiency, Fractures)  3. Elevated cholesterol LDLs were slightly elevated at last visit but there was improvement to normal with total cholesterol and LDLs were higher one year ago Will check lipid panel  - Lipid panel  4. Class 1 obesity due to excess calories with body mass index (BMI) of 30.0 to 30.9 in adult, unspecified whether serious comorbidity present  He is encouraged to initially strive for BMI less than 30 to decrease cardiac risk. Congratulated on his 5 lb weight loss since his last visit.  He is advised to exercise no less than 150 minutes per week.    Patient was given opportunity to ask questions. Patient verbalized understanding of the plan and was able to repeat key elements of the plan. All questions were answered to their satisfaction.  Minette Brine, FNP   I, Minette Brine, FNP, have reviewed all documentation for this visit. The documentation on 10/23/20 for the exam, diagnosis, procedures, and orders are all accurate and complete.   IF YOU HAVE BEEN REFERRED TO A SPECIALIST, IT MAY TAKE 1-2 WEEKS TO SCHEDULE/PROCESS THE REFERRAL. IF YOU HAVE NOT HEARD FROM US/SPECIALIST IN TWO WEEKS, PLEASE GIVE [place] A CALL AT [phone].   THE PATIENT IS ENCOURAGED TO PRACTICE SOCIAL DISTANCING DUE TO THE COVID-19 PANDEMIC.

## 2020-10-23 NOTE — Patient Instructions (Signed)

## 2020-10-24 ENCOUNTER — Encounter: Payer: Self-pay | Admitting: Nurse Practitioner

## 2020-10-24 LAB — HEMOGLOBIN A1C
Est. average glucose Bld gHb Est-mCnc: 137 mg/dL
Hgb A1c MFr Bld: 6.4 % — ABNORMAL HIGH (ref 4.8–5.6)

## 2020-10-24 LAB — LIPID PANEL
Chol/HDL Ratio: 3.9 ratio (ref 0.0–5.0)
Cholesterol, Total: 220 mg/dL — ABNORMAL HIGH (ref 100–199)
HDL: 56 mg/dL (ref >39)
LDL Chol Calc (NIH): 146 mg/dL — ABNORMAL HIGH (ref 0–99)
Triglycerides: 99 mg/dL (ref 0–149)
VLDL Cholesterol Cal: 18 mg/dL (ref 5–40)

## 2020-10-24 LAB — BMP8+EGFR
BUN/Creatinine Ratio: 13 (ref 9–20)
BUN: 17 mg/dL (ref 6–24)
CO2: 23 mmol/L (ref 20–29)
Calcium: 9.3 mg/dL (ref 8.7–10.2)
Chloride: 104 mmol/L (ref 96–106)
Creatinine, Ser: 1.28 mg/dL — ABNORMAL HIGH (ref 0.76–1.27)
Glucose: 114 mg/dL — ABNORMAL HIGH (ref 70–99)
Potassium: 4.7 mmol/L (ref 3.5–5.2)
Sodium: 141 mmol/L (ref 134–144)
eGFR: 65 mL/min/{1.73_m2} (ref >59)

## 2020-10-24 LAB — VITAMIN D 25 HYDROXY (VIT D DEFICIENCY, FRACTURES): Vit D, 25-Hydroxy: 138 ng/mL — ABNORMAL HIGH (ref 30.0–100.0)

## 2021-06-13 ENCOUNTER — Encounter: Payer: 59 | Admitting: Nurse Practitioner

## 2021-11-15 DIAGNOSIS — M79672 Pain in left foot: Secondary | ICD-10-CM | POA: Insufficient documentation

## 2021-12-06 LAB — HM DIABETES EYE EXAM

## 2021-12-12 ENCOUNTER — Encounter: Payer: Self-pay | Admitting: Nurse Practitioner

## 2021-12-12 ENCOUNTER — Ambulatory Visit (INDEPENDENT_AMBULATORY_CARE_PROVIDER_SITE_OTHER): Payer: 59 | Admitting: Nurse Practitioner

## 2021-12-12 VITALS — BP 112/68 | HR 64 | Temp 97.8°F | Ht 70.0 in | Wt 217.0 lb

## 2021-12-12 DIAGNOSIS — Z2821 Immunization not carried out because of patient refusal: Secondary | ICD-10-CM

## 2021-12-12 DIAGNOSIS — R7309 Other abnormal glucose: Secondary | ICD-10-CM

## 2021-12-12 DIAGNOSIS — Z0000 Encounter for general adult medical examination without abnormal findings: Secondary | ICD-10-CM | POA: Diagnosis not present

## 2021-12-12 DIAGNOSIS — E7800 Pure hypercholesterolemia, unspecified: Secondary | ICD-10-CM | POA: Diagnosis not present

## 2021-12-12 DIAGNOSIS — Z125 Encounter for screening for malignant neoplasm of prostate: Secondary | ICD-10-CM

## 2021-12-12 DIAGNOSIS — E661 Drug-induced obesity: Secondary | ICD-10-CM

## 2021-12-12 DIAGNOSIS — Z6831 Body mass index (BMI) 31.0-31.9, adult: Secondary | ICD-10-CM

## 2021-12-12 NOTE — Patient Instructions (Signed)
Health Maintenance, Male Adopting a healthy lifestyle and getting preventive care are important in promoting health and wellness. Ask your health care provider about: The right schedule for you to have regular tests and exams. Things you can do on your own to prevent diseases and keep yourself healthy. What should I know about diet, weight, and exercise? Eat a healthy diet  Eat a diet that includes plenty of vegetables, fruits, low-fat dairy products, and lean protein. Do not eat a lot of foods that are high in solid fats, added sugars, or sodium. Maintain a healthy weight Body mass index (BMI) is a measurement that can be used to identify possible weight problems. It estimates body fat based on height and weight. Your health care provider can help determine your BMI and help you achieve or maintain a healthy weight. Get regular exercise Get regular exercise. This is one of the most important things you can do for your health. Most adults should: Exercise for at least 150 minutes each week. The exercise should increase your heart rate and make you sweat (moderate-intensity exercise). Do strengthening exercises at least twice a week. This is in addition to the moderate-intensity exercise. Spend less time sitting. Even light physical activity can be beneficial. Watch cholesterol and blood lipids Have your blood tested for lipids and cholesterol at 58 years of age, then have this test every 5 years. You may need to have your cholesterol levels checked more often if: Your lipid or cholesterol levels are high. You are older than 58 years of age. You are at high risk for heart disease. What should I know about cancer screening? Many types of cancers can be detected early and may often be prevented. Depending on your health history and family history, you may need to have cancer screening at various ages. This may include screening for: Colorectal cancer. Prostate cancer. Skin cancer. Lung  cancer. What should I know about heart disease, diabetes, and high blood pressure? Blood pressure and heart disease High blood pressure causes heart disease and increases the risk of stroke. This is more likely to develop in people who have high blood pressure readings or are overweight. Talk with your health care provider about your target blood pressure readings. Have your blood pressure checked: Every 3-5 years if you are 18-39 years of age. Every year if you are 40 years old or older. If you are between the ages of 65 and 75 and are a current or former smoker, ask your health care provider if you should have a one-time screening for abdominal aortic aneurysm (AAA). Diabetes Have regular diabetes screenings. This checks your fasting blood sugar level. Have the screening done: Once every three years after age 45 if you are at a normal weight and have a low risk for diabetes. More often and at a younger age if you are overweight or have a high risk for diabetes. What should I know about preventing infection? Hepatitis B If you have a higher risk for hepatitis B, you should be screened for this virus. Talk with your health care provider to find out if you are at risk for hepatitis B infection. Hepatitis C Blood testing is recommended for: Everyone born from 1945 through 1965. Anyone with known risk factors for hepatitis C. Sexually transmitted infections (STIs) You should be screened each year for STIs, including gonorrhea and chlamydia, if: You are sexually active and are younger than 58 years of age. You are older than 58 years of age and your   health care provider tells you that you are at risk for this type of infection. Your sexual activity has changed since you were last screened, and you are at increased risk for chlamydia or gonorrhea. Ask your health care provider if you are at risk. Ask your health care provider about whether you are at high risk for HIV. Your health care provider  may recommend a prescription medicine to help prevent HIV infection. If you choose to take medicine to prevent HIV, you should first get tested for HIV. You should then be tested every 3 months for as long as you are taking the medicine. Follow these instructions at home: Alcohol use Do not drink alcohol if your health care provider tells you not to drink. If you drink alcohol: Limit how much you have to 0-2 drinks a day. Know how much alcohol is in your drink. In the [place] one drink equals one 12 oz bottle of beer (355 mL), one 5 oz glass of wine (148 mL), or one 1 oz glass of hard liquor (44 mL). Lifestyle Do not use any products that contain nicotine or tobacco. These products include cigarettes, chewing tobacco, and vaping devices, such as e-cigarettes. If you need help quitting, ask your health care provider. Do not use street drugs. Do not share needles. Ask your health care provider for help if you need support or information about quitting drugs. General instructions Schedule regular health, dental, and eye exams. Stay current with your vaccines. Tell your health care provider if: You often feel depressed. You have ever been abused or do not feel safe at home. Summary Adopting a healthy lifestyle and getting preventive care are important in promoting health and wellness. Follow your health care provider's instructions about healthy diet, exercising, and getting tested or screened for diseases. Follow your health care provider's instructions on monitoring your cholesterol and blood pressure. This information is not intended to replace advice given to you by your health care provider. Make sure you discuss any questions you have with your health care provider. Document Revised: 05/29/2020 Document Reviewed: 05/29/2020 Elsevier Patient Education  2023 [employer]  

## 2021-12-12 NOTE — Progress Notes (Signed)
I,Tianna Badgett,acting as a Education administrator for [employer], FNP.,have documented all relevant documentation on the behalf of Minette Brine, FNP,as directed by  Minette Brine, FNP while in the presence of Minette Brine, [place].  Subjective:     Patient ID: Corey Ayala , male    DOB: 1965-03-16 , 56 y.o.   MRN: [id number]   Chief Complaint  Patient presents with   Annual Exam    HPI  Patient here for hm.  Overall doing well. He has been having foot pain related to a tendon (possible rupture). He has not been running for the last couple weeks.  Wt Readings from Last 3 Encounters: 12/12/21 : 217 lb (98.4 kg) 10/23/20 : 209 lb 3.2 oz (94.9 kg) 06/12/20 : 214 lb 6.4 oz (97.3 kg)       History reviewed. No pertinent past medical history.   Family History  Problem Relation Age of Onset   Diabetes Mother    Hypertension Mother    Arthritis Mother      Current Outpatient Medications:    meloxicam (MOBIC) 15 MG tablet, Take 15 mg by mouth 3 (three) times daily., Disp: , Rfl:    Multiple Vitamin (MULTIVITAMIN) tablet, Take 1 tablet by mouth daily., Disp: , Rfl:    UNABLE TO FIND, Chaga Daily, Disp: , Rfl:    UNABLE TO FIND, Ganoderma lucidum: take one capsule daily, Disp: , Rfl:    Allergies  Allergen Reactions   Penicillins Swelling and Rash    Has patient had a PCN reaction causing immediate rash, facial/tongue/throat swelling, SOB or lightheadedness with hypotension: yes Has patient had a PCN reaction causing severe rash involving mucus membranes or skin necrosis: unknown Has patient had a PCN reaction that required hospitalization: yes office visit If all of the above answers are "NO", then may proceed with Cephalosporin use. Has patient had a PCN reaction occurring within the last 10 years: no     Men's preventive visit. Patient Health Questionnaire (PHQ-2) is  [place] Office Visit from 12/12/2021 in [facility]  PHQ-2 Total Score 0     Patient  is on a regular diet. Exercising regularly. Marital status: Married. Relevant history for alcohol use is:  Social History   Substance and Sexual Activity  Alcohol Use No   Relevant history for tobacco use is:  Social History   Tobacco Use  Smoking Status Never  Smokeless Tobacco Never  .   Review of Systems  Constitutional: Negative.   HENT: Negative.    Eyes: Negative.   Respiratory: Negative.    Cardiovascular: Negative.   Gastrointestinal: Negative.   Endocrine: Negative.   Genitourinary: Negative.   Musculoskeletal: Negative.   Skin: Negative.   Allergic/Immunologic: Negative.   Neurological: Negative.   Hematological: Negative.   Psychiatric/Behavioral: Negative.       Today's Vitals   12/12/21 1106  BP: 112/68  Pulse: 64  Temp: 97.8 F (36.6 C)  TempSrc: Oral  Weight: 217 lb (98.4 kg)  Height: _0  (1.778 m)   Body mass index is 31.14 kg/m.  Wt Readings from Last 3 Encounters:  12/12/21 217 lb (98.4 kg)  10/23/20 209 lb 3.2 oz (94.9 kg)  06/12/20 214 lb 6.4 oz (97.3 kg)    Objective:  Physical Exam Vitals reviewed.  Constitutional:      General: He is not in acute distress.    Appearance: Normal appearance. He is obese.  HENT:     Head: Normocephalic and atraumatic.  Right Ear: Tympanic membrane, ear canal and external ear normal. There is no impacted cerumen.     Left Ear: Tympanic membrane, ear canal and external ear normal. There is no impacted cerumen.     Nose:     Comments: Deferred - masked    Mouth/Throat:     Comments: Deferred - masked Eyes:     Extraocular Movements: Extraocular movements intact.     Conjunctiva/sclera: Conjunctivae normal.     Pupils: Pupils are equal, round, and reactive to light.  Cardiovascular:     Rate and Rhythm: Normal rate and regular rhythm.     Pulses: Normal pulses.     Heart sounds: Normal heart sounds. No murmur heard. Pulmonary:     Effort: Pulmonary effort is normal. No respiratory distress.      Breath sounds: Normal breath sounds. No wheezing.  Abdominal:     General: Abdomen is flat. Bowel sounds are normal. There is no distension.     Palpations: Abdomen is soft.     Tenderness: There is no abdominal tenderness.  Genitourinary:    Prostate: Normal.     Rectum: Guaiac result negative (negative).  Musculoskeletal:        General: No swelling or tenderness. Normal range of motion.     Cervical back: Normal range of motion and neck supple.  Skin:    General: Skin is warm and dry.     Capillary Refill: Capillary refill takes less than 2 seconds.     Findings: No rash.  Neurological:     General: No focal deficit present.     Mental Status: He is alert and oriented to person, place, and time.     Cranial Nerves: No cranial nerve deficit.     Motor: No weakness.  Psychiatric:        Mood and Affect: Mood normal.        Behavior: Behavior normal.        Thought Content: Thought content normal.        Judgment: Judgment normal.         Assessment And Plan:    1. Encounter for general adult medical examination w/o abnormal findings Behavior modifications discussed and diet history reviewed.   Pt will continue to exercise regularly and modify diet with low GI, plant based foods and decrease intake of processed foods.  Recommend intake of daily multivitamin, Vitamin D, and calcium.  Recommend colonoscopy (up to date) for preventive screenings, as well as recommend immunizations that include influenza, TDAP, and Shingles (decline)  2. Encounter for prostate cancer screening Comments: Manual prostate exam is normal. - PSA  3. Class 1 drug-induced obesity without serious comorbidity with body mass index (BMI) of 31.0 to 31.9 in adult She is encouraged to strive for BMI less than 30 to decrease cardiac risk. Advised to aim for at least 150 minutes of exercise per week.  4. Influenza vaccination declined Patient declined influenza vaccination at this time. Patient is  aware that influenza vaccine prevents illness in 70% of healthy people, and reduces hospitalizations to 30-70% in elderly. This vaccine is recommended annually. Education has been provided regarding the importance of this vaccine but patient still declined. Advised may receive this vaccine at local pharmacy or Health Dept.or vaccine clinic. Aware to provide a copy of the vaccination record if obtained from local pharmacy or Health Dept.  Pt is willing to accept risk associated with refusing vaccination.  5. Herpes zoster vaccination declined Declines shingrix, educated on  disease process and is aware if he changes his mind to notify office  6. Abnormal glucose Comments: No current medications, he is focusing on diet and exercise. Will check HgbA1c. - CBC - Hemoglobin A1c - CMP14+EGFR  7. Elevated cholesterol Comments: Cholesterol levels have been slightly elevated, diet controlled. Encouraged to eat a low fat diet. - CBC - CMP14+EGFR - Lipid panel   Patient was given opportunity to ask questions. Patient verbalized understanding of the plan and was able to repeat key elements of the plan. All questions were answered to their satisfaction.   Minette Brine, FNP   I, Minette Brine, FNP, have reviewed all documentation for this visit. The documentation on 12/12/21 for the exam, diagnosis, procedures, and orders are all accurate and complete.   THE PATIENT IS ENCOURAGED TO PRACTICE SOCIAL DISTANCING DUE TO THE COVID-19 PANDEMIC.

## 2021-12-13 LAB — CBC
Hematocrit: 42.2 % (ref 37.5–51.0)
Hemoglobin: 14.1 g/dL (ref 13.0–17.7)
MCH: 28.6 pg (ref 26.6–33.0)
MCHC: 33.4 g/dL (ref 31.5–35.7)
MCV: 86 fL (ref 79–97)
Platelets: 306 10*3/uL (ref 150–450)
RBC: 4.93 x10E6/uL (ref 4.14–5.80)
RDW: 12.2 % (ref 11.6–15.4)
WBC: 4.2 10*3/uL (ref 3.4–10.8)

## 2021-12-13 LAB — CMP14+EGFR
ALT: 19 IU/L (ref 0–44)
AST: 22 IU/L (ref 0–40)
Albumin/Globulin Ratio: 1.7 (ref 1.2–2.2)
Albumin: 4.2 g/dL (ref 3.8–4.9)
Alkaline Phosphatase: 85 IU/L (ref 44–121)
BUN/Creatinine Ratio: 11 (ref 9–20)
BUN: 14 mg/dL (ref 6–24)
Bilirubin Total: 0.5 mg/dL (ref 0.0–1.2)
CO2: 26 mmol/L (ref 20–29)
Calcium: 9.3 mg/dL (ref 8.7–10.2)
Chloride: 103 mmol/L (ref 96–106)
Creatinine, Ser: 1.33 mg/dL — ABNORMAL HIGH (ref 0.76–1.27)
Globulin, Total: 2.5 g/dL (ref 1.5–4.5)
Glucose: 97 mg/dL (ref 70–99)
Potassium: 4.8 mmol/L (ref 3.5–5.2)
Sodium: 140 mmol/L (ref 134–144)
Total Protein: 6.7 g/dL (ref 6.0–8.5)
eGFR: 62 mL/min/{1.73_m2} (ref >59)

## 2021-12-13 LAB — LIPID PANEL
Chol/HDL Ratio: 3.5 ratio (ref 0.0–5.0)
Cholesterol, Total: 209 mg/dL — ABNORMAL HIGH (ref 100–199)
HDL: 60 mg/dL (ref >39)
LDL Chol Calc (NIH): 124 mg/dL — ABNORMAL HIGH (ref 0–99)
Triglycerides: 142 mg/dL (ref 0–149)
VLDL Cholesterol Cal: 25 mg/dL (ref 5–40)

## 2021-12-13 LAB — PSA: Prostate Specific Ag, Serum: 3.2 ng/mL (ref 0.0–4.0)

## 2021-12-13 LAB — HEMOGLOBIN A1C
Est. average glucose Bld gHb Est-mCnc: 140 mg/dL
Hgb A1c MFr Bld: 6.5 % — ABNORMAL HIGH (ref 4.8–5.6)

## 2022-04-07 IMAGING — CT CT ABDOMEN WO/W CM
3 of 7 series · 13 of 32 positions shown, 18 images · IV contrast (APPLIED)
Comparison: Renal ultrasound, 01/25/2020

CLINICAL DATA: Rule out left renal mass, abnormal ultrasound

EXAM:
CT ABDOMEN WITHOUT AND WITH CONTRAST
TECHNIQUE: Multidetector CT imaging of the abdomen was performed following the
standard protocol before and following the bolus administration of
intravenous contrast.
CONTRAST:  100mL IZVN56-BVV IOPAMIDOL (IZVN56-BVV) INJECTION 61%,
additional oral enteric contrast

[Series 2: abd w/(date) · axial · 0.81mm/px · z∈[-274,-136]mm · 3 of 93 slices shown]
[im 24/93  soft-tissue]
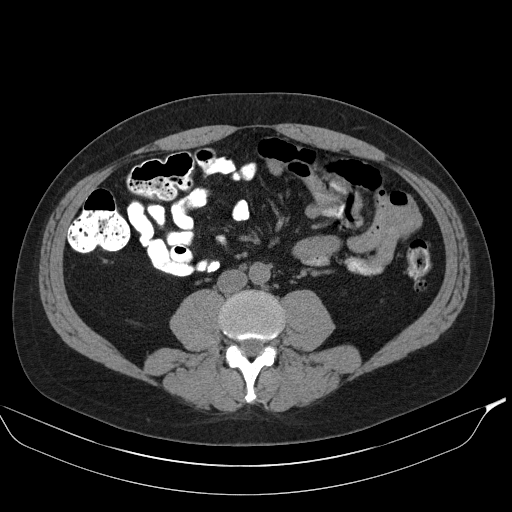
[im 47/93  soft-tissue]
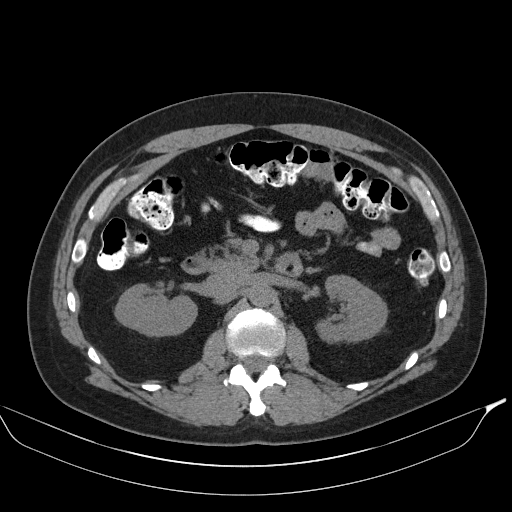
[im 70/93  soft-tissue]
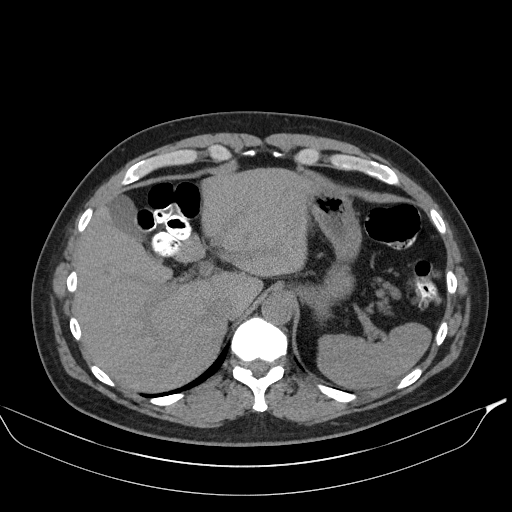

[Series 3: arterial · axial · arterial · 0.81mm/px · z∈[-296,-104]mm · 5 of 97 slices shown, 10 images]
[im 17/97  soft-tissue]
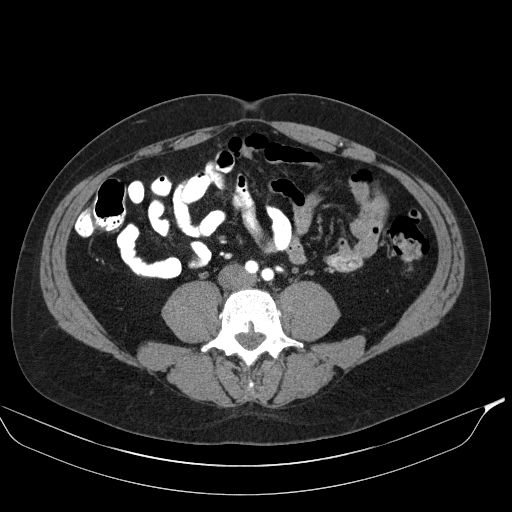
[im 17/97  bone]
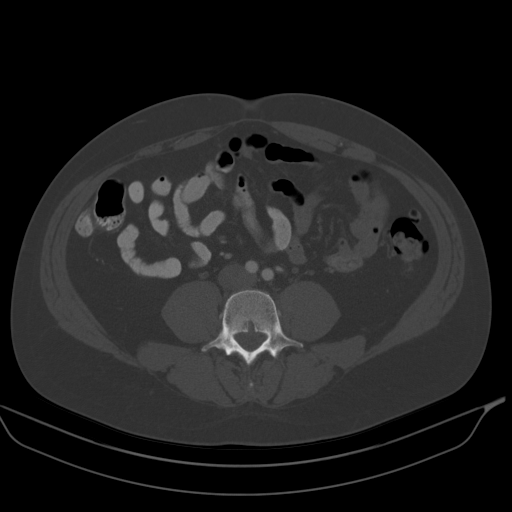
[im 33/97  soft-tissue]
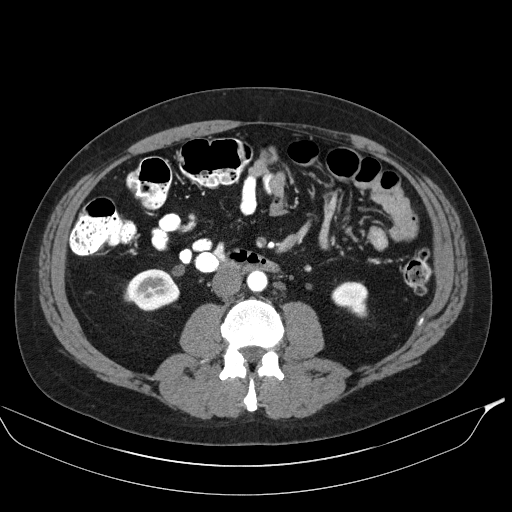
[im 33/97  lung]
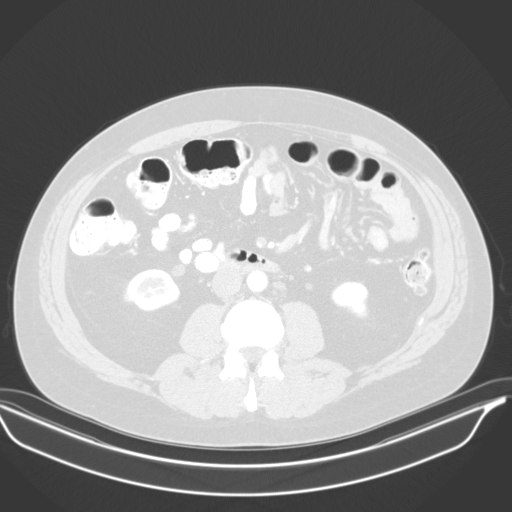
[im 49/97  soft-tissue]
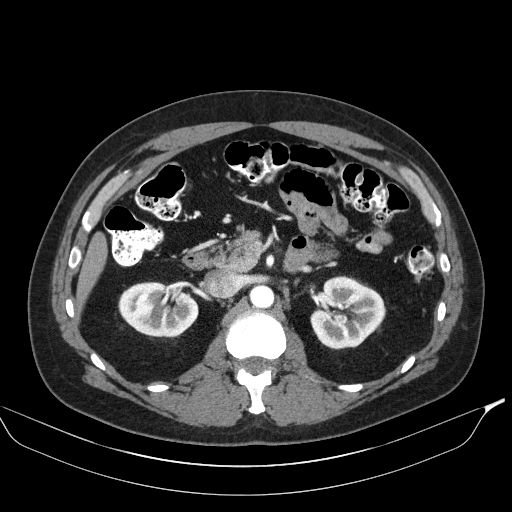
[im 49/97  lung]
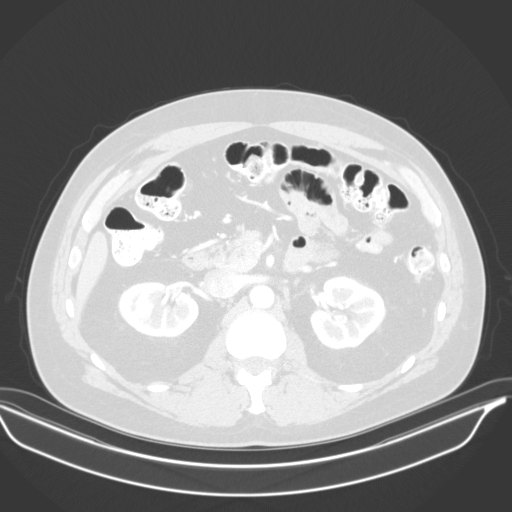
[im 65/97  soft-tissue]
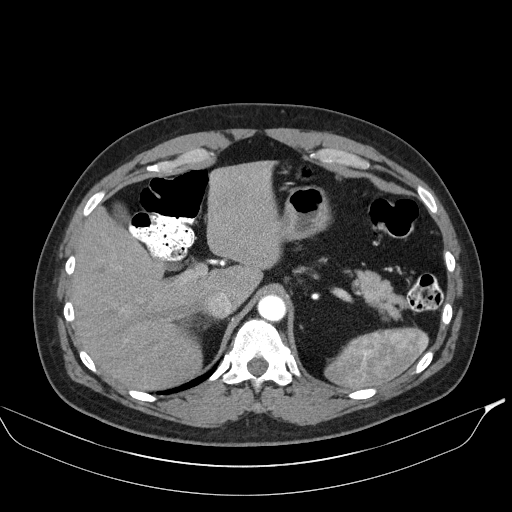
[im 65/97  lung]
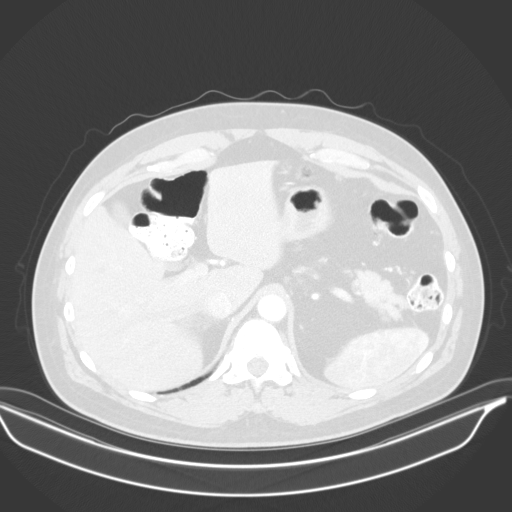
[im 81/97  soft-tissue]
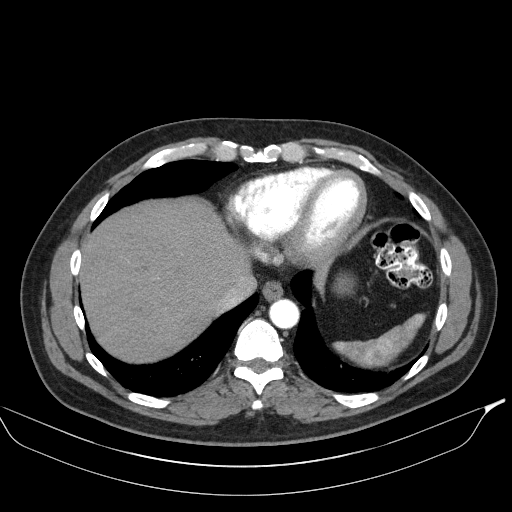
[im 81/97  lung]
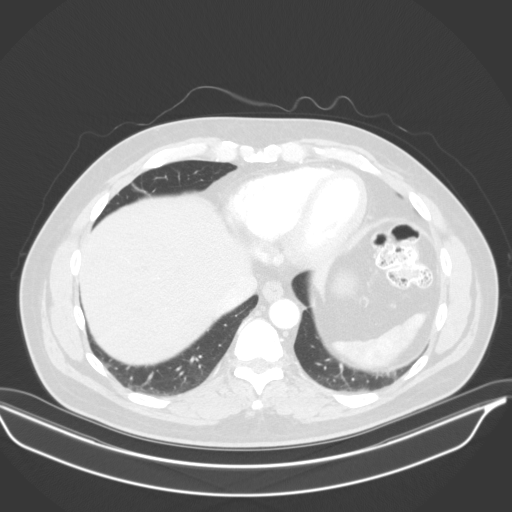

[Series 5: nephrographic · axial · 0.81mm/px · z∈[-296,-104]mm · 5 of 98 slices shown]
[im 17/98  soft-tissue]
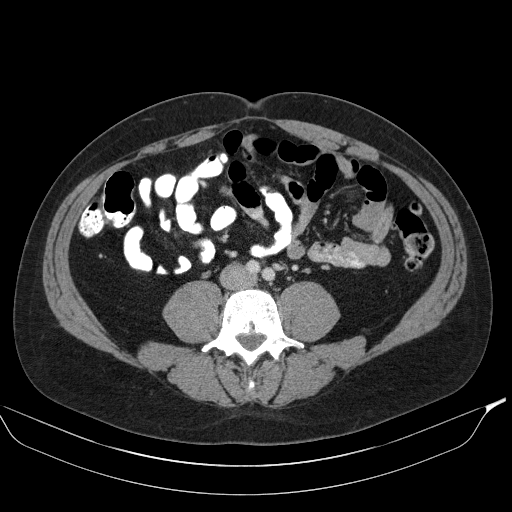
[im 33/98  soft-tissue]
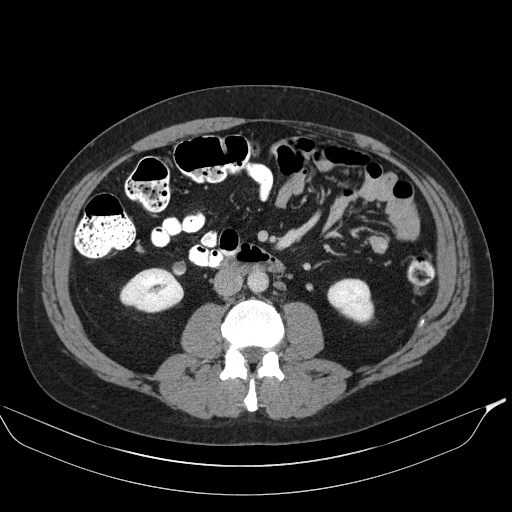
[im 49/98  soft-tissue]
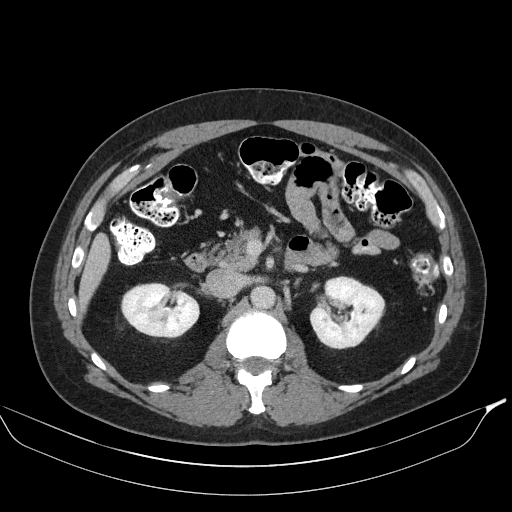
[im 65/98  soft-tissue]
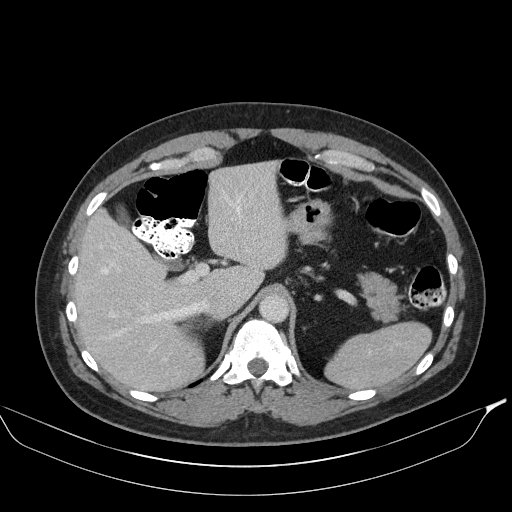
[im 81/98  soft-tissue]
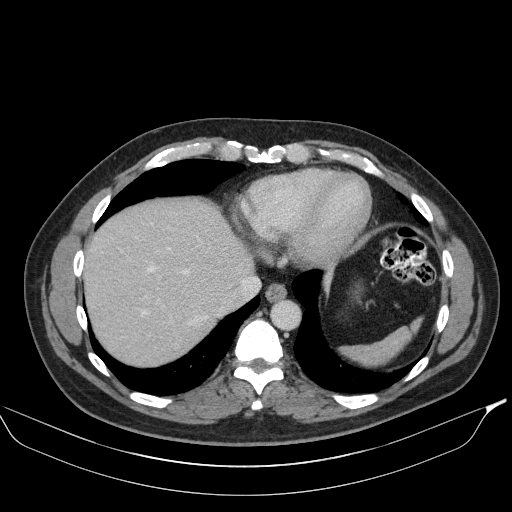

[13 of 32 positions shown; findings below may reference images not displayed]

FINDINGS: Lower chest: No acute abnormality.

Hepatobiliary: No focal liver abnormality is seen. No gallstones,
gallbladder wall thickening, or biliary dilatation.

Pancreas: Unremarkable. No pancreatic ductal dilatation or
surrounding inflammatory changes.

Spleen: Normal in size without focal abnormality.

Adrenals/Urinary Tract: Adrenal glands are unremarkable. Kidneys are
normal, without renal calculi, focal lesion, or hydronephrosis.

Stomach/Bowel: Stomach is within normal limits. Descending colonic
diverticula. No evidence of bowel wall thickening, distention, or
inflammatory changes.

Vascular/Lymphatic: No significant vascular findings are present. No
enlarged abdominal or pelvic lymph nodes.

Other: No abdominal wall hernia or abnormality.

Musculoskeletal: No acute or significant osseous findings.
IMPRESSION: 1. No evidence of left renal mass or suspicious contrast
enhancement.
2. Descending colonic diverticulosis without evidence of acute
diverticulitis.

## 2022-06-12 ENCOUNTER — Ambulatory Visit: Payer: 59 | Admitting: Nurse Practitioner

## 2022-06-12 ENCOUNTER — Encounter: Payer: Self-pay | Admitting: Nurse Practitioner

## 2022-06-12 VITALS — BP 100/80 | HR 85 | Temp 98.5°F | Ht 70.0 in | Wt 218.4 lb

## 2022-06-12 DIAGNOSIS — E661 Drug-induced obesity: Secondary | ICD-10-CM

## 2022-06-12 DIAGNOSIS — E1169 Type 2 diabetes mellitus with other specified complication: Secondary | ICD-10-CM | POA: Diagnosis not present

## 2022-06-12 DIAGNOSIS — R319 Hematuria, unspecified: Secondary | ICD-10-CM | POA: Diagnosis not present

## 2022-06-12 DIAGNOSIS — Z79899 Other long term (current) drug therapy: Secondary | ICD-10-CM

## 2022-06-12 DIAGNOSIS — E7800 Pure hypercholesterolemia, unspecified: Secondary | ICD-10-CM | POA: Diagnosis not present

## 2022-06-12 DIAGNOSIS — R051 Acute cough: Secondary | ICD-10-CM

## 2022-06-12 DIAGNOSIS — Z6831 Body mass index (BMI) 31.0-31.9, adult: Secondary | ICD-10-CM

## 2022-06-12 DIAGNOSIS — Z2821 Immunization not carried out because of patient refusal: Secondary | ICD-10-CM

## 2022-06-12 DIAGNOSIS — E66811 Obesity, class 1: Secondary | ICD-10-CM | POA: Insufficient documentation

## 2022-06-12 NOTE — Progress Notes (Signed)
Hershal Coria Martin,acting as a Neurosurgeon for [name], FNP.,have documented all relevant documentation on the behalf of [name], FNP,as directed by  [name], FNP while in the presence of [name], FNP.    Subjective:     Patient ID: Corey Ayala , male    DOB: 1965/03/14 , 57 y.o.   MRN: [id number]   Chief Complaint  Patient presents with   Diabetes    HPI  Patient presents today for a pre DM and chol check, patient reports compliance with medications and has no other concerns today.   BP Readings from Last 3 Encounters: 06/12/22 : 100/80 12/12/21 : 112/68 10/23/20 : 114/82  Wt Readings from Last 3 Encounters: 06/12/22 : 218 lb 6.4 oz (99.1 kg) 12/12/21 : 217 lb (98.4 kg) 10/23/20 : 209 lb 3.2 oz (94.9 kg)  He has been going back and forth to [place] to see his mother who has been sick.       History reviewed. No pertinent past medical history.   Family History  Problem Relation Age of Onset   Diabetes Mother    Hypertension Mother    Arthritis Mother      Current Outpatient Medications:    Multiple Vitamin (MULTIVITAMIN) tablet, Take 1 tablet by mouth daily., Disp: , Rfl:    UNABLE TO FIND, Chaga Daily, Disp: , Rfl:    UNABLE TO FIND, Ganoderma lucidum: take one capsule daily, Disp: , Rfl:    Allergies  Allergen Reactions   Penicillins Swelling and Rash    Has patient had a PCN reaction causing immediate rash, facial/tongue/throat swelling, SOB or lightheadedness with hypotension: yes Has patient had a PCN reaction causing severe rash involving mucus membranes or skin necrosis: unknown Has patient had a PCN reaction that required hospitalization: yes office visit If all of the above answers are "NO", then may proceed with Cephalosporin use. Has patient had a PCN reaction occurring within the last 10 years: no     Review of Systems  Constitutional: Negative.   HENT: Negative.    Respiratory: Negative.    Cardiovascular: Negative.    Gastrointestinal: Negative.   Neurological: Negative.  Negative for dizziness and headaches.  Psychiatric/Behavioral: Negative.    All other systems reviewed and are negative.    Today's Vitals   06/12/22 1003  BP: 100/80  Pulse: 85  Temp: 98.5 F (36.9 C)  TempSrc: Oral  Weight: 218 lb 6.4 oz (99.1 kg)  Height: 5\' 10"  (1.778 m)  PainSc: 0-No pain   Body mass index is 31.34 kg/m.  Wt Readings from Last 3 Encounters:  06/12/22 218 lb 6.4 oz (99.1 kg)  12/12/21 217 lb (98.4 kg)  10/23/20 209 lb 3.2 oz (94.9 kg)    The 10-year ASCVD risk score (Arnett DK, et al., 2019) is: 9.2%   Values used to calculate the score:     Age: 84 years     Sex: Male     Is Non-Hispanic African American: Yes     Diabetic: Yes     Tobacco smoker: No     Systolic Blood Pressure: 100 mmHg     Is BP treated: No     HDL Cholesterol: 60 mg/dL     Total Cholesterol: 209 mg/dL  Objective:  Physical Exam Vitals reviewed.  Constitutional:      General: He is not in acute distress.    Appearance: Normal appearance.  Cardiovascular:     Rate and Rhythm: Normal rate and regular rhythm.  Pulses: Normal pulses.     Heart sounds: Normal heart sounds. No murmur heard. Pulmonary:     Effort: Pulmonary effort is normal. No respiratory distress.     Breath sounds: Normal breath sounds. No wheezing.  Skin:    General: Skin is warm and dry.     Capillary Refill: Capillary refill takes less than 2 seconds.  Neurological:     General: No focal deficit present.     Mental Status: He is alert and oriented to person, place, and time.     Cranial Nerves: No cranial nerve deficit.     Motor: No weakness.  Psychiatric:        Mood and Affect: Mood normal.        Behavior: Behavior normal.        Thought Content: Thought content normal.         Assessment And Plan:     1. Type 2 diabetes mellitus with obesity (HCC) Comments: This is a new diagnosis, his most recent HgbA1c was 6.5. Discussed  standard of care and risk factors associated. Will see what his A1c is this visit. Will send letter to Dr. Sharlot Gowda for his diabetic exam.  - Hemoglobin A1c - Lipid panel - CMP14+EGFR - Microalbumin / Creatinine Urine Ratio  2. Elevated cholesterol Comments: Discussed possibly starting a statin to help protect heart with diabetes will also help lower total cholesterol and LDL. Will check lipid panel. - Lipid panel  3. Class 1 drug-induced obesity without serious comorbidity with body mass index (BMI) of 31.0 to 31.9 in adult he is encouraged to strive for BMI less than 30 to decrease cardiac risk. Advised to aim for at least 150 minutes of exercise per week. Weight is stable.   4. COVID-19 vaccination declined  5. Herpes zoster vaccination declined Declines shingrix, educated on disease process and is aware if he changes his mind to notify office  6. Other long term (current) drug therapy - TSH  7. Acute cough Comments: Had been having a hacking cough, previous history of smoking, will order CXR for a baseline    Return for controlled DM check 4 months.  Patient was given opportunity to ask questions. Patient verbalized understanding of the plan and was able to repeat key elements of the plan. All questions were answered to their satisfaction.  [name], FNP   I, [name], FNP, have reviewed all documentation for this visit. The documentation on 06/12/22 for the exam, diagnosis, procedures, and orders are all accurate and complete.   IF YOU HAVE BEEN REFERRED TO A SPECIALIST, IT MAY TAKE 1-2 WEEKS TO SCHEDULE/PROCESS THE REFERRAL. IF YOU HAVE NOT HEARD FROM US/SPECIALIST IN TWO WEEKS, PLEASE GIVE [place] A CALL AT [phone].   THE PATIENT IS ENCOURAGED TO PRACTICE SOCIAL DISTANCING DUE TO THE COVID-19 PANDEMIC.

## 2022-06-12 NOTE — Patient Instructions (Addendum)
You can go to [address] for your xray.

## 2022-06-13 LAB — CMP14+EGFR
ALT: 15 IU/L (ref 0–44)
AST: 17 IU/L (ref 0–40)
Albumin/Globulin Ratio: 1.6 (ref 1.2–2.2)
Albumin: 4.2 g/dL (ref 3.8–4.9)
Alkaline Phosphatase: 90 IU/L (ref 44–121)
BUN/Creatinine Ratio: 10 (ref 9–20)
BUN: 14 mg/dL (ref 6–24)
Bilirubin Total: 0.5 mg/dL (ref 0.0–1.2)
CO2: 22 mmol/L (ref 20–29)
Calcium: 9.8 mg/dL (ref 8.7–10.2)
Chloride: 103 mmol/L (ref 96–106)
Creatinine, Ser: 1.45 mg/dL — ABNORMAL HIGH (ref 0.76–1.27)
Globulin, Total: 2.6 g/dL (ref 1.5–4.5)
Glucose: 109 mg/dL — ABNORMAL HIGH (ref 70–99)
Potassium: 4.9 mmol/L (ref 3.5–5.2)
Sodium: 140 mmol/L (ref 134–144)
Total Protein: 6.8 g/dL (ref 6.0–8.5)
eGFR: 56 mL/min/{1.73_m2} — ABNORMAL LOW (ref >59)

## 2022-06-13 LAB — TSH: TSH: 0.893 u[IU]/mL (ref 0.450–4.500)

## 2022-06-13 LAB — HEMOGLOBIN A1C
Est. average glucose Bld gHb Est-mCnc: 143 mg/dL
Hgb A1c MFr Bld: 6.6 % — ABNORMAL HIGH (ref 4.8–5.6)

## 2022-06-13 LAB — LIPID PANEL
Chol/HDL Ratio: 3.7 ratio (ref 0.0–5.0)
Cholesterol, Total: 238 mg/dL — ABNORMAL HIGH (ref 100–199)
HDL: 64 mg/dL (ref >39)
LDL Chol Calc (NIH): 156 mg/dL — ABNORMAL HIGH (ref 0–99)
Triglycerides: 102 mg/dL (ref 0–149)
VLDL Cholesterol Cal: 18 mg/dL (ref 5–40)

## 2022-06-13 LAB — MICROALBUMIN / CREATININE URINE RATIO
Creatinine, Urine: 166.5 mg/dL
Microalb/Creat Ratio: 104 mg/g creat — ABNORMAL HIGH (ref 0–29)
Microalbumin, Urine: 173.9 ug/mL

## 2022-06-27 MED ORDER — DAPAGLIFLOZIN PROPANEDIOL 10 MG PO TABS: 10.0000 mg | ORAL_TABLET | Freq: Every day | ORAL | 2 refills | Status: DC

## 2022-10-21 ENCOUNTER — Ambulatory Visit: Payer: 59 | Admitting: Nurse Practitioner

## 2022-10-21 ENCOUNTER — Encounter: Payer: Self-pay | Admitting: Nurse Practitioner

## 2022-10-21 VITALS — BP 100/70 | HR 85 | Temp 98.7°F | Ht 70.0 in | Wt 217.8 lb

## 2022-10-21 DIAGNOSIS — Z2821 Immunization not carried out because of patient refusal: Secondary | ICD-10-CM | POA: Insufficient documentation

## 2022-10-21 DIAGNOSIS — E1169 Type 2 diabetes mellitus with other specified complication: Secondary | ICD-10-CM | POA: Diagnosis not present

## 2022-10-21 DIAGNOSIS — Z6831 Body mass index (BMI) 31.0-31.9, adult: Secondary | ICD-10-CM

## 2022-10-21 DIAGNOSIS — E661 Drug-induced obesity: Secondary | ICD-10-CM

## 2022-10-21 DIAGNOSIS — E785 Hyperlipidemia, unspecified: Secondary | ICD-10-CM

## 2022-10-21 LAB — LIPID PANEL
Chol/HDL Ratio: 3.7 {ratio} (ref 0.0–5.0)
Cholesterol, Total: 218 mg/dL — ABNORMAL HIGH (ref 100–199)
HDL: 59 mg/dL (ref >39)
LDL Chol Calc (NIH): 139 mg/dL — ABNORMAL HIGH (ref 0–99)
Triglycerides: 111 mg/dL (ref 0–149)
VLDL Cholesterol Cal: 20 mg/dL (ref 5–40)

## 2022-10-21 LAB — BMP8+EGFR
BUN/Creatinine Ratio: 14 (ref 9–20)
BUN: 17 mg/dL (ref 6–24)
CO2: 25 mmol/L (ref 20–29)
Calcium: 9.4 mg/dL (ref 8.7–10.2)
Chloride: 104 mmol/L (ref 96–106)
Creatinine, Ser: 1.22 mg/dL (ref 0.76–1.27)
Glucose: 121 mg/dL — ABNORMAL HIGH (ref 70–99)
Potassium: 4.8 mmol/L (ref 3.5–5.2)
Sodium: 141 mmol/L (ref 134–144)
eGFR: 68 mL/min/{1.73_m2} (ref >59)

## 2022-10-21 LAB — HEMOGLOBIN A1C
Est. average glucose Bld gHb Est-mCnc: 146 mg/dL
Hgb A1c MFr Bld: 6.7 % — ABNORMAL HIGH (ref 4.8–5.6)

## 2022-10-21 MED ORDER — DAPAGLIFLOZIN PROPANEDIOL 10 MG PO TABS: 10.0000 mg | ORAL_TABLET | Freq: Every day | ORAL | 1 refills | Status: AC

## 2022-10-21 NOTE — Progress Notes (Signed)
Madelaine Bhat, CMA,acting as a Neurosurgeon for [name], FNP.,have documented all relevant documentation on the behalf of [name], FNP,as directed by  [name], FNP while in the presence of [name], FNP.  Subjective:  Patient ID: Corey Ayala , male    DOB: 02-May-1965 , 57 y.o.   MRN: [id number]  Chief Complaint  Patient presents with   Diabetes    HPI  Patient presents today for a DM follow up, Patient reports compliance with medication. Patient denies any chest pain, SOB, or headaches. Patient has no concerns today. He is trying to drink more water and eating better, trying to avoid eating out as much. He is not taking the [place].   BP Readings from Last 3 Encounters: 10/21/22 : 100/70 06/12/22 : 100/80 12/12/21 : 112/68       History reviewed. No pertinent past medical history.   Family History  Problem Relation Age of Onset   Diabetes Mother    Hypertension Mother    Arthritis Mother      Current Outpatient Medications:    Multiple Vitamin (MULTIVITAMIN) tablet, Take 1 tablet by mouth daily., Disp: , Rfl:    UNABLE TO FIND, Chaga Daily, Disp: , Rfl:    UNABLE TO FIND, Ganoderma lucidum: take one capsule daily, Disp: , Rfl:    dapagliflozin propanediol (FARXIGA) 10 MG TABS tablet, Take 1 tablet (10 mg total) by mouth daily before breakfast., Disp: 90 tablet, Rfl: 1   Allergies  Allergen Reactions   Penicillins Swelling and Rash    Has patient had a PCN reaction causing immediate rash, facial/tongue/throat swelling, SOB or lightheadedness with hypotension: yes Has patient had a PCN reaction causing severe rash involving mucus membranes or skin necrosis: unknown Has patient had a PCN reaction that required hospitalization: yes office visit If all of the above answers are "NO", then may proceed with Cephalosporin use. Has patient had a PCN reaction occurring within the last 10 years: no     Review of Systems  Constitutional: Negative.   HENT:  Negative.    Eyes: Negative.   Respiratory: Negative.    Cardiovascular: Negative.   Gastrointestinal: Negative.   Neurological: Negative.   Psychiatric/Behavioral: Negative.       Today's Vitals   10/21/22 0840  BP: 100/70  Pulse: 85  Temp: 98.7 F (37.1 C)  TempSrc: Oral  Weight: 217 lb 12.8 oz (98.8 kg)  Height: 5\' 10"  (1.778 m)   Body mass index is 31.25 kg/m.  Wt Readings from Last 3 Encounters:  10/21/22 217 lb 12.8 oz (98.8 kg)  06/12/22 218 lb 6.4 oz (99.1 kg)  12/12/21 217 lb (98.4 kg)    The 10-year ASCVD risk score (Arnett DK, et al., 2019) is: 9.3%   Values used to calculate the score:     Age: 64 years     Sex: Male     Is Non-Hispanic African American: Yes     Diabetic: Yes     Tobacco smoker: No     Systolic Blood Pressure: 100 mmHg     Is BP treated: No     HDL Cholesterol: 64 mg/dL     Total Cholesterol: 238 mg/dL  Objective:  Physical Exam Vitals reviewed.  Constitutional:      General: He is not in acute distress.    Appearance: Normal appearance.  Cardiovascular:     Rate and Rhythm: Normal rate and regular rhythm.     Pulses: Normal pulses.  Heart sounds: Normal heart sounds. No murmur heard. Pulmonary:     Effort: Pulmonary effort is normal. No respiratory distress.     Breath sounds: Normal breath sounds. No wheezing.  Skin:    General: Skin is warm and dry.     Capillary Refill: Capillary refill takes less than 2 seconds.  Neurological:     General: No focal deficit present.     Mental Status: He is alert and oriented to person, place, and time.     Cranial Nerves: No cranial nerve deficit.     Motor: No weakness.  Psychiatric:        Mood and Affect: Mood normal.        Behavior: Behavior normal.        Thought Content: Thought content normal.         Assessment And Plan:  Type 2 diabetes mellitus with hyperlipidemia (HCC) Assessment & Plan: HgbA1c increased at last visit, he did not start the [place]. I have sent the  medication in again and given him a coupon. Discussed side effects and verbalizes understanding. Diabetic foot exam done. Requested notes from most recent eye exam. I will also send him for a calcium risk score scan due to ASCVD risk score of 9.3%, not currently on statin. Discussed purpose of statin for both ASCVD risk and diabetes   Orders: -     Hemoglobin A1c -     Lipid panel -     BMP8+eGFR -     Dapagliflozin Propanediol; Take 1 tablet (10 mg total) by mouth daily before breakfast.  Dispense: 90 tablet; Refill: 1 -     CT CARDIAC SCORING (SELF PAY ONLY); Future  Influenza vaccination declined Assessment & Plan: Patient declined influenza vaccination at this time. Patient is aware that influenza vaccine prevents illness in 70% of healthy people, and reduces hospitalizations to 30-70% in elderly. This vaccine is recommended annually. Education has been provided regarding the importance of this vaccine but patient still declined. Advised may receive this vaccine at local pharmacy or Health Dept.or vaccine clinic. Aware to provide a copy of the vaccination record if obtained from local pharmacy or Health Dept.  Pt is willing to accept risk associated with refusing vaccination.    Herpes zoster vaccination declined Assessment & Plan: Declines shingrix, educated on disease process and is aware if he changes his mind to notify office    COVID-19 vaccination declined Assessment & Plan: Declines covid 19 vaccine. Discussed risk of covid 1 and if he changes her mind about the vaccine to call the office. Education has been provided regarding the importance of this vaccine but patient still declined. Advised may receive this vaccine at local pharmacy or Health Dept.or vaccine clinic. Aware to provide a copy of the vaccination record if obtained from local pharmacy or Health Dept.  Encouraged to take multivitamin, vitamin d, vitamin c and zinc to increase immune system. Aware can call office if  would like to have vaccine here at office. Verbalized acceptance and understanding.    Class 1 drug-induced obesity without serious comorbidity with body mass index (BMI) of 31.0 to 31.9 in adult Assessment & Plan: he is encouraged to strive for BMI less than 30 to decrease cardiac risk. Advised to aim for at least 150 minutes of exercise per week.      Return for controlled DM check 4 months.  Patient was given opportunity to ask questions. Patient verbalized understanding of the plan and was able to repeat  key elements of the plan. All questions were answered to their satisfaction.    Jeanell Sparrow, FNP, have reviewed all documentation for this visit. The documentation on 10/21/22 for the exam, diagnosis, procedures, and orders are all accurate and complete.   IF YOU HAVE BEEN REFERRED TO A SPECIALIST, IT MAY TAKE 1-2 WEEKS TO SCHEDULE/PROCESS THE REFERRAL. IF YOU HAVE NOT HEARD FROM US/SPECIALIST IN TWO WEEKS, PLEASE GIVE [place] A CALL AT [phone].

## 2022-10-21 NOTE — Assessment & Plan Note (Signed)

## 2022-10-21 NOTE — Assessment & Plan Note (Signed)
he is encouraged to strive for BMI less than 30 to decrease cardiac risk. Advised to aim for at least 150 minutes of exercise per week.

## 2022-10-21 NOTE — Assessment & Plan Note (Signed)
Declines shingrix, educated on disease process and is aware if he changes his mind to notify office  

## 2022-10-21 NOTE — Patient Instructions (Signed)
Be sure to hold Marcelline Deist if you have any vomiting or diarrhea Stay well hydrated with water

## 2022-10-21 NOTE — Assessment & Plan Note (Signed)

## 2022-10-21 NOTE — Assessment & Plan Note (Addendum)
HgbA1c increased at last visit, he did not start the [place]. I have sent the medication in again and given him a coupon. Discussed side effects and verbalizes understanding. Diabetic foot exam done. Requested notes from most recent eye exam. I will also send him for a calcium risk score scan due to ASCVD risk score of 9.3%, not currently on statin. Discussed purpose of statin for both ASCVD risk and diabetes

## 2022-10-25 ENCOUNTER — Ambulatory Visit (HOSPITAL_COMMUNITY)
Admission: RE | Admit: 2022-10-25 | Discharge: 2022-10-25 | Disposition: A | Discharge status: Home / Self Care | Payer: 59 | Source: Ambulatory Visit | Attending: Nurse Practitioner | Admitting: Nurse Practitioner

## 2022-10-25 DIAGNOSIS — E785 Hyperlipidemia, unspecified: Secondary | ICD-10-CM | POA: Insufficient documentation

## 2022-10-25 DIAGNOSIS — E1169 Type 2 diabetes mellitus with other specified complication: Secondary | ICD-10-CM | POA: Insufficient documentation

## 2022-11-25 NOTE — Progress Notes (Signed)
Call him to make him aware he will need to see his eye doctor again specifically for a diabetic exam the one he had did not address this.

## 2022-12-12 LAB — HM DIABETES EYE EXAM

## 2022-12-16 ENCOUNTER — Encounter: Payer: Self-pay | Admitting: Nurse Practitioner

## 2022-12-16 ENCOUNTER — Ambulatory Visit: Payer: 59 | Admitting: Nurse Practitioner

## 2022-12-16 VITALS — BP 120/70 | HR 60 | Temp 97.9°F | Ht 70.0 in | Wt 211.8 lb

## 2022-12-16 DIAGNOSIS — E1169 Type 2 diabetes mellitus with other specified complication: Secondary | ICD-10-CM

## 2022-12-16 DIAGNOSIS — E785 Hyperlipidemia, unspecified: Secondary | ICD-10-CM

## 2022-12-16 DIAGNOSIS — Z0000 Encounter for general adult medical examination without abnormal findings: Secondary | ICD-10-CM | POA: Insufficient documentation

## 2022-12-16 DIAGNOSIS — Z2821 Immunization not carried out because of patient refusal: Secondary | ICD-10-CM

## 2022-12-16 DIAGNOSIS — E7800 Pure hypercholesterolemia, unspecified: Secondary | ICD-10-CM | POA: Diagnosis not present

## 2022-12-16 DIAGNOSIS — R809 Proteinuria, unspecified: Secondary | ICD-10-CM

## 2022-12-16 DIAGNOSIS — Z1211 Encounter for screening for malignant neoplasm of colon: Secondary | ICD-10-CM | POA: Diagnosis not present

## 2022-12-16 DIAGNOSIS — Z125 Encounter for screening for malignant neoplasm of prostate: Secondary | ICD-10-CM

## 2022-12-16 DIAGNOSIS — Z79899 Other long term (current) drug therapy: Secondary | ICD-10-CM

## 2022-12-16 DIAGNOSIS — M7660 Achilles tendinitis, unspecified leg: Secondary | ICD-10-CM

## 2022-12-16 LAB — POCT URINALYSIS DIP (CLINITEK)
Bilirubin, UA: NEGATIVE
Blood, UA: NEGATIVE
Glucose, UA: NEGATIVE mg/dL
Ketones, POC UA: NEGATIVE mg/dL
Leukocytes, UA: NEGATIVE
Nitrite, UA: NEGATIVE
POC PROTEIN,UA: 30 — AB
Spec Grav, UA: 1.025 (ref 1.010–1.025)
Urobilinogen, UA: 0.2 U/dL
pH, UA: 6 (ref 5.0–8.0)

## 2022-12-16 LAB — POC HEMOCCULT BLD/STL (OFFICE/1-CARD/DIAGNOSTIC): Fecal Occult Blood, POC: NEGATIVE

## 2022-12-16 NOTE — Assessment & Plan Note (Signed)
Minimal pain but has a firm area to left achilles. If worsens will return call to office may need an xray or ultrasound

## 2022-12-16 NOTE — Assessment & Plan Note (Signed)
Will check lipid panel, no current medications. He would benefit from a statin due to the diagnosis of diabetes.

## 2022-12-16 NOTE — Assessment & Plan Note (Signed)
Behavior modifications discussed and diet history reviewed.   Pt will continue to exercise regularly and modify diet with low GI, plant based foods and decrease intake of processed foods.  Recommend intake of daily multivitamin, Vitamin D, and calcium.  Recommend colonoscopy for preventive screenings, as well as recommend immunizations that include influenza, TDAP, and Shingles  

## 2022-12-16 NOTE — Assessment & Plan Note (Addendum)
Will recheck HgbA1c. He has lost some weight this may help lower his A1c as well. Continue focusing on healthy diet and regular exercise. He had a diabetic eye exam last week awaiting report

## 2022-12-16 NOTE — Progress Notes (Unsigned)
Madelaine Bhat, CMA,acting as a Neurosurgeon for [name], FNP.,have documented all relevant documentation on the behalf of [name], FNP,as directed by  [name], FNP while in the presence of [name], FNP.  Subjective:   Patient ID: Corey Ayala , male    DOB: January 18, 1966 , 57 y.o.   MRN: [id number]  Chief Complaint  Patient presents with   Annual Exam    HPI  Patient presents today for HM, Patient reports compliance with medication. Patient denies any chest pain, SOB, or headaches. Patient has no concerns today.      Past Medical History:  Diagnosis Date   Allergy 1968   Penicillin     Family History  Problem Relation Age of Onset   Diabetes Mother    Hypertension Mother    Arthritis Mother    Kidney disease Father      Current Outpatient Medications:    dapagliflozin propanediol (FARXIGA) 10 MG TABS tablet, Take 1 tablet (10 mg total) by mouth daily before breakfast., Disp: 90 tablet, Rfl: 1   Multiple Vitamin (MULTIVITAMIN) tablet, Take 1 tablet by mouth daily., Disp: , Rfl:    UNABLE TO FIND, [place] Daily, Disp: , Rfl:    UNABLE TO FIND, Ganoderma lucidum: take one capsule daily, Disp: , Rfl:    Allergies  Allergen Reactions   Penicillins Swelling and Rash    Has patient had a PCN reaction causing immediate rash, facial/tongue/throat swelling, SOB or lightheadedness with hypotension: yes Has patient had a PCN reaction causing severe rash involving mucus membranes or skin necrosis: unknown Has patient had a PCN reaction that required hospitalization: yes office visit If all of the above answers are "NO", then may proceed with Cephalosporin use. Has patient had a PCN reaction occurring within the last 10 years: no     Men's preventive visit. Patient Health Questionnaire (PHQ-2) is  Flowsheet Row Office Visit from 12/16/2022 in [facility]  PHQ-2 Total Score 0     Patient is on a healthy diet. Exercising regularly.   Marital status: Married. Relevant history for alcohol use is:  Social History   Substance and Sexual Activity  Alcohol Use Not Currently   Comment: No alcohol since 1987  . Relevant history for tobacco use is:  Social History   Tobacco Use  Smoking Status Former   Current packs/day: 0.00   Average packs/day: 0.5 packs/day for 2.0 years (1.0 ttl pk-yrs)   Types: Cigarettes, Cigars   Quit date: 02/23/1984   Years since quitting: 38.8  Smokeless Tobacco Never  .   Review of Systems  Constitutional: Negative.   HENT: Negative.    Eyes: Negative.   Respiratory: Negative.    Cardiovascular: Negative.   Gastrointestinal: Negative.   Endocrine: Negative.   Genitourinary: Negative.   Musculoskeletal: Negative.   Skin: Negative.   Allergic/Immunologic: Negative.   Neurological: Negative.   Hematological: Negative.   Psychiatric/Behavioral: Negative.       Today's Vitals   12/16/22 1107  BP: 120/70  Pulse: 60  Temp: 97.9 F (36.6 C)  TempSrc: Oral  Weight: 211 lb 12.8 oz (96.1 kg)  Height: 5\' 10"  (1.778 m)  PainSc: 0-No pain   Body mass index is 30.39 kg/m.  Wt Readings from Last 3 Encounters:  12/16/22 211 lb 12.8 oz (96.1 kg)  10/21/22 217 lb 12.8 oz (98.8 kg)  06/12/22 218 lb 6.4 oz (99.1 kg)    Objective:  Physical Exam Vitals reviewed.  Constitutional:  General: He is not in acute distress.    Appearance: Normal appearance. He is obese.  HENT:     Head: Normocephalic and atraumatic.     Right Ear: Tympanic membrane, ear canal and external ear normal. There is no impacted cerumen.     Left Ear: Tympanic membrane, ear canal and external ear normal. There is no impacted cerumen.     Nose: Nose normal.     Mouth/Throat:     Mouth: Mucous membranes are moist.  Eyes:     Extraocular Movements: Extraocular movements intact.     Conjunctiva/sclera: Conjunctivae normal.     Pupils: Pupils are equal, round, and reactive to light.  Cardiovascular:     Rate  and Rhythm: Normal rate and regular rhythm.     Pulses: Normal pulses.     Heart sounds: Normal heart sounds. No murmur heard. Pulmonary:     Effort: Pulmonary effort is normal. No respiratory distress.     Breath sounds: Normal breath sounds. No wheezing.  Abdominal:     General: Abdomen is flat. Bowel sounds are normal. There is no distension.     Palpations: Abdomen is soft.     Tenderness: There is no abdominal tenderness.  Genitourinary:    Prostate: Normal.     Rectum: Guaiac result negative (negative).  Musculoskeletal:        General: No swelling or tenderness. Normal range of motion.     Cervical back: Normal range of motion and neck supple.  Skin:    General: Skin is warm and dry.     Capillary Refill: Capillary refill takes less than 2 seconds.     Findings: No rash.  Neurological:     General: No focal deficit present.     Mental Status: He is alert and oriented to person, place, and time.     Cranial Nerves: No cranial nerve deficit.     Motor: No weakness.  Psychiatric:        Mood and Affect: Mood normal.        Behavior: Behavior normal.        Thought Content: Thought content normal.        Judgment: Judgment normal.     Assessment And Plan:    Encounter for annual health examination Assessment & Plan: Behavior modifications discussed and diet history reviewed.   Pt will continue to exercise regularly and modify diet with low GI, plant based foods and decrease intake of processed foods.  Recommend intake of daily multivitamin, Vitamin D, and calcium.  Recommend colonoscopy for preventive screenings, as well as recommend immunizations that include influenza, TDAP, and Shingles    Encounter for prostate cancer screening -     PSA  COVID-19 vaccination declined Assessment & Plan: Declines covid 19 vaccine. Discussed risk of covid 12 and if he changes her mind about the vaccine to call the office. Education has been provided regarding the importance of  this vaccine but patient still declined. Advised may receive this vaccine at local pharmacy or Health Dept.or vaccine clinic. Aware to provide a copy of the vaccination record if obtained from local pharmacy or Health Dept.  Encouraged to take multivitamin, vitamin d, vitamin c and zinc to increase immune system. Aware can call office if would like to have vaccine here at office. Verbalized acceptance and understanding.    Type 2 diabetes mellitus with hyperlipidemia [facility]) Assessment & Plan: Will recheck HgbA1c. He has lost some weight this may help lower his A1c  as well. Continue focusing on healthy diet and regular exercise. He had a diabetic eye exam last week awaiting report  Orders: -     EKG 12-Lead -     POCT URINALYSIS DIP (CLINITEK) -     Microalbumin / creatinine urine ratio -     CMP14+EGFR -     Hemoglobin A1c  Elevated cholesterol Assessment & Plan: Will check lipid panel, no current medications. He would benefit from a statin due to the diagnosis of diabetes.   Orders: -     Lipid panel  Other long term (current) drug therapy -     CBC with Differential/Platelet  Encounter for Hemoccult screening -     POC Hemoccult Bld/Stl (1-Cd Office Dx)  Achilles tendon pain Assessment & Plan: Minimal pain but has a firm area to left achilles. If worsens will return call to office may need an xray or ultrasound    Return for controlled DM check 4 months, 1 year physical. Patient was given opportunity to ask questions. Patient verbalized understanding of the plan and was able to repeat key elements of the plan. All questions were answered to their satisfaction.   [name], FNP  I, [name], FNP, have reviewed all documentation for this visit. The documentation on 12/16/22 for the exam, diagnosis, procedures, and orders are all accurate and complete.

## 2022-12-16 NOTE — Assessment & Plan Note (Signed)

## 2022-12-17 LAB — CBC WITH DIFFERENTIAL/PLATELET
Basophils Absolute: 0 10*3/uL (ref 0.0–0.2)
Basos: 1 %
EOS (ABSOLUTE): 0.1 10*3/uL (ref 0.0–0.4)
Eos: 1 %
Hematocrit: 43.6 % (ref 37.5–51.0)
Hemoglobin: 14.3 g/dL (ref 13.0–17.7)
Immature Grans (Abs): 0 10*3/uL (ref 0.0–0.1)
Immature Granulocytes: 0 %
Lymphocytes Absolute: 1.8 10*3/uL (ref 0.7–3.1)
Lymphs: 46 %
MCH: 28.5 pg (ref 26.6–33.0)
MCHC: 32.8 g/dL (ref 31.5–35.7)
MCV: 87 fL (ref 79–97)
Monocytes Absolute: 0.3 10*3/uL (ref 0.1–0.9)
Monocytes: 7 %
Neutrophils Absolute: 1.8 10*3/uL (ref 1.4–7.0)
Neutrophils: 45 %
Platelets: 314 10*3/uL (ref 150–450)
RBC: 5.02 x10E6/uL (ref 4.14–5.80)
RDW: 12.2 % (ref 11.6–15.4)
WBC: 3.9 10*3/uL (ref 3.4–10.8)

## 2022-12-17 LAB — LIPID PANEL
Chol/HDL Ratio: 3.1 {ratio} (ref 0.0–5.0)
Cholesterol, Total: 198 mg/dL (ref 100–199)
HDL: 63 mg/dL (ref >39)
LDL Chol Calc (NIH): 121 mg/dL — ABNORMAL HIGH (ref 0–99)
Triglycerides: 78 mg/dL (ref 0–149)
VLDL Cholesterol Cal: 14 mg/dL (ref 5–40)

## 2022-12-17 LAB — CMP14+EGFR
ALT: 16 [IU]/L (ref 0–44)
AST: 17 [IU]/L (ref 0–40)
Albumin: 4.2 g/dL (ref 3.8–4.9)
Alkaline Phosphatase: 83 [IU]/L (ref 44–121)
BUN/Creatinine Ratio: 13 (ref 9–20)
BUN: 16 mg/dL (ref 6–24)
Bilirubin Total: 0.5 mg/dL (ref 0.0–1.2)
CO2: 25 mmol/L (ref 20–29)
Calcium: 9.5 mg/dL (ref 8.7–10.2)
Chloride: 105 mmol/L (ref 96–106)
Creatinine, Ser: 1.26 mg/dL (ref 0.76–1.27)
Globulin, Total: 2.4 g/dL (ref 1.5–4.5)
Glucose: 108 mg/dL — ABNORMAL HIGH (ref 70–99)
Potassium: 4.8 mmol/L (ref 3.5–5.2)
Sodium: 142 mmol/L (ref 134–144)
Total Protein: 6.6 g/dL (ref 6.0–8.5)
eGFR: 66 mL/min/{1.73_m2} (ref >59)

## 2022-12-17 LAB — HEMOGLOBIN A1C
Est. average glucose Bld gHb Est-mCnc: 137 mg/dL
Hgb A1c MFr Bld: 6.4 % — ABNORMAL HIGH (ref 4.8–5.6)

## 2022-12-17 LAB — MICROALBUMIN / CREATININE URINE RATIO
Creatinine, Urine: 166 mg/dL
Microalb/Creat Ratio: 100 mg/g{creat} — ABNORMAL HIGH (ref 0–29)
Microalbumin, Urine: 166 ug/mL

## 2022-12-17 LAB — PSA: Prostate Specific Ag, Serum: 3.3 ng/mL (ref 0.0–4.0)

## 2022-12-18 ENCOUNTER — Encounter: Payer: Self-pay | Admitting: Nurse Practitioner

## 2022-12-18 DIAGNOSIS — Z125 Encounter for screening for malignant neoplasm of prostate: Secondary | ICD-10-CM | POA: Insufficient documentation

## 2022-12-18 MED ORDER — ATORVASTATIN CALCIUM 10 MG PO TABS: 10.0000 mg | ORAL_TABLET | Freq: Every day | ORAL | 2 refills | Status: DC

## 2023-02-20 ENCOUNTER — Ambulatory Visit: Payer: 59 | Admitting: Nurse Practitioner

## 2023-04-04 LAB — LAB REPORT - SCANNED
Albumin, Urine POC: 221.5
Albumin/Creatinine Ratio, Urine, POC: 104
Creatinine, POC: 212.7 mg/dL
EGFR: 55
HM HIV Screening: NEGATIVE
HM Hepatitis Screen: NEGATIVE

## 2023-04-21 ENCOUNTER — Encounter: Payer: Self-pay | Admitting: Nurse Practitioner

## 2023-04-21 ENCOUNTER — Ambulatory Visit: Payer: 59 | Admitting: Nurse Practitioner

## 2023-04-21 VITALS — BP 120/80 | HR 70 | Temp 97.8°F | Ht 70.0 in | Wt 211.2 lb

## 2023-04-21 DIAGNOSIS — E1169 Type 2 diabetes mellitus with other specified complication: Secondary | ICD-10-CM

## 2023-04-21 DIAGNOSIS — E785 Hyperlipidemia, unspecified: Secondary | ICD-10-CM

## 2023-04-21 DIAGNOSIS — E782 Mixed hyperlipidemia: Secondary | ICD-10-CM | POA: Diagnosis not present

## 2023-04-21 DIAGNOSIS — Z2821 Immunization not carried out because of patient refusal: Secondary | ICD-10-CM | POA: Diagnosis not present

## 2023-04-21 MED ORDER — BLOOD GLUCOSE MONITOR KIT: PACK | 0 refills | Status: AC

## 2023-04-21 MED ORDER — ATORVASTATIN CALCIUM 10 MG PO TABS: ORAL_TABLET | ORAL | 2 refills | Status: DC

## 2023-04-21 NOTE — Progress Notes (Signed)
 Corey Ayala, CMA,acting as a Neurosurgeon for Corey Felts, FNP.,have documented all relevant documentation on the behalf of Corey Felts, FNP,as directed by  Corey Felts, FNP while in the presence of Corey Felts, FNP.  Subjective:  Patient ID: Corey Ayala , male    DOB: [birth year] , 58 y.o.   MRN: [id number]  Chief Complaint  Patient presents with   Diabetes    HPI  Patient presents today for a dm follow up, Patient reports compliance with medication. Patient denies any chest pain, SOB, or headaches. Patient has no concerns today. Patient reports he didn't get the atorvastatin due to the side effect but has made diet changes and exercising. He has not yet started the farxiga. He was awaiting a return call from Dr. Thedore Mins.      Past Medical History:  Diagnosis Date   Allergy 1968   Penicillin     Family History  Problem Relation Age of Onset   Diabetes Mother    Hypertension Mother    Arthritis Mother    Kidney disease Father      Current Outpatient Medications:    blood glucose meter kit and supplies KIT, Dispense based on patient and insurance preference. Use up to four times daily as directed., Disp: 1 each, Rfl: 0   Multiple Vitamin (MULTIVITAMIN) tablet, Take 1 tablet by mouth daily., Disp: , Rfl:    UNABLE TO FIND, Chaga Daily, Disp: , Rfl:    UNABLE TO FIND, Ganoderma lucidum: take one capsule daily, Disp: , Rfl:    atorvastatin (LIPITOR) 10 MG tablet, Take 1 tablet by mouth every other day, Disp: 45 tablet, Rfl: 2   dapagliflozin propanediol (FARXIGA) 10 MG TABS tablet, Take 1 tablet (10 mg total) by mouth daily before breakfast. (Patient not taking: Reported on 04/21/2023), Disp: 90 tablet, Rfl: 1   Allergies  Allergen Reactions   Penicillins Swelling and Rash    Has patient had a PCN reaction causing immediate rash, facial/tongue/throat swelling, SOB or lightheadedness with hypotension: yes Has patient had a PCN reaction causing severe rash involving mucus  membranes or skin necrosis: unknown Has patient had a PCN reaction that required hospitalization: yes office visit If all of the above answers are "NO", then may proceed with Cephalosporin use. Has patient had a PCN reaction occurring within the last 10 years: no     Review of Systems  Constitutional: Negative.   HENT: Negative.    Eyes: Negative.   Respiratory: Negative.    Cardiovascular: Negative.   Gastrointestinal: Negative.   Neurological: Negative.   Psychiatric/Behavioral: Negative.       Today's Vitals   04/21/23 1147  BP: 120/80  Pulse: 70  Temp: 97.8 F (36.6 C)  TempSrc: Oral  Weight: 211 lb 3.2 oz (95.8 kg)  Height: 5\' 10"  (1.778 m)  PainSc: 0-No pain   Body mass index is 30.3 kg/m.  Wt Readings from Last 3 Encounters:  04/21/23 211 lb 3.2 oz (95.8 kg)  12/16/22 211 lb 12.8 oz (96.1 kg)  10/21/22 217 lb 12.8 oz (98.8 kg)    Objective:  Physical Exam Vitals reviewed.  Constitutional:      General: He is not in acute distress.    Appearance: Normal appearance.  Cardiovascular:     Rate and Rhythm: Normal rate and regular rhythm.     Pulses: Normal pulses.     Heart sounds: Normal heart sounds. No murmur heard. Pulmonary:     Effort: Pulmonary effort is normal. No respiratory  distress.     Breath sounds: Normal breath sounds. No wheezing.  Skin:    General: Skin is warm and dry.     Capillary Refill: Capillary refill takes less than 2 seconds.  Neurological:     General: No focal deficit present.     Mental Status: He is alert and oriented to person, place, and time.     Cranial Nerves: No cranial nerve deficit.     Motor: No weakness.  Psychiatric:        Mood and Affect: Mood normal.        Behavior: Behavior normal.        Thought Content: Thought content normal.     Assessment And Plan:  Type 2 diabetes mellitus with hyperlipidemia [facility]) Assessment & Plan: Long discussion about taking his Marcelline Deist, he has been to Nephrology and they agree  with him taking the [place]. Will check A1c today.   Orders: -     Hemoglobin A1c -     BMP8+eGFR -     blood glucose meter kit and supplies; Dispense based on patient and insurance preference. Use up to four times daily as directed.  Dispense: 1 each; Refill: 0 -     Atorvastatin Calcium; Take 1 tablet by mouth every other day  Dispense: 45 tablet; Refill: 2  Mixed hyperlipidemia Assessment & Plan: Will check lipid panel, no current medications. He has not been taking any of the medications.   Orders: -     Lipid panel -     Atorvastatin Calcium; Take 1 tablet by mouth every other day  Dispense: 45 tablet; Refill: 2  COVID-19 vaccination declined Assessment & Plan: Declines covid 19 vaccine. Discussed risk of covid 56 and if he changes her mind about the vaccine to call the office. Education has been provided regarding the importance of this vaccine but patient still declined. Advised may receive this vaccine at local pharmacy or Health Dept.or vaccine clinic. Aware to provide a copy of the vaccination record if obtained from local pharmacy or Health Dept.  Encouraged to take multivitamin, vitamin d, vitamin c and zinc to increase immune system. Aware can call office if would like to have vaccine here at office. Verbalized acceptance and understanding.    Herpes zoster vaccination declined Assessment & Plan: Declines shingrix, educated on disease process and is aware if he changes his mind to notify office      Return for keep same next.  Patient was given opportunity to ask questions. Patient verbalized understanding of the plan and was able to repeat key elements of the plan. All questions were answered to their satisfaction.    Jeanell Sparrow, FNP, have reviewed all documentation for this visit. The documentation on 04/21/23 for the exam, diagnosis, procedures, and orders are all accurate and complete.   IF YOU HAVE BEEN REFERRED TO A SPECIALIST, IT MAY TAKE 1-2 WEEKS TO  SCHEDULE/PROCESS THE REFERRAL. IF YOU HAVE NOT HEARD FROM US/SPECIALIST IN TWO WEEKS, PLEASE GIVE [place] A CALL AT [phone].

## 2023-04-21 NOTE — Patient Instructions (Signed)
 [facility]

## 2023-04-22 ENCOUNTER — Encounter: Payer: Self-pay | Admitting: Nurse Practitioner

## 2023-04-22 LAB — LIPID PANEL
Chol/HDL Ratio: 3.1 ratio (ref 0.0–5.0)
Cholesterol, Total: 196 mg/dL (ref 100–199)
HDL: 63 mg/dL (ref >39)
LDL Chol Calc (NIH): 118 mg/dL — ABNORMAL HIGH (ref 0–99)
Triglycerides: 83 mg/dL (ref 0–149)
VLDL Cholesterol Cal: 15 mg/dL (ref 5–40)

## 2023-04-22 LAB — BMP8+EGFR
BUN/Creatinine Ratio: 10 (ref 10–24)
BUN: 12 mg/dL (ref 8–27)
CO2: 25 mmol/L (ref 20–29)
Calcium: 9.3 mg/dL (ref 8.6–10.2)
Chloride: 105 mmol/L (ref 96–106)
Creatinine, Ser: 1.24 mg/dL (ref 0.76–1.27)
Glucose: 91 mg/dL (ref 70–99)
Potassium: 5.3 mmol/L — ABNORMAL HIGH (ref 3.5–5.2)
Sodium: 142 mmol/L (ref 134–144)
eGFR: 67 mL/min/{1.73_m2} (ref >59)

## 2023-04-22 LAB — HEMOGLOBIN A1C
Est. average glucose Bld gHb Est-mCnc: 137 mg/dL
Hgb A1c MFr Bld: 6.4 % — ABNORMAL HIGH (ref 4.8–5.6)

## 2023-05-01 NOTE — Assessment & Plan Note (Signed)

## 2023-05-01 NOTE — Assessment & Plan Note (Signed)
 Declines shingrix, educated on disease process and is aware if he changes his mind to notify office

## 2023-05-01 NOTE — Assessment & Plan Note (Signed)
 Long discussion about taking his Marcelline Deist, he has been to Nephrology and they agree with him taking the [place]. Will check A1c today.

## 2023-05-01 NOTE — Assessment & Plan Note (Signed)
 Will check lipid panel, no current medications. He has not been taking any of the medications.

## 2023-10-09 LAB — LAB REPORT - SCANNED
Albumin, Urine POC: 92.5
Creatinine, POC: 115.9 mg/dL
EGFR: 63
Microalb Creat Ratio: 80

## 2023-11-14 LAB — HM COLONOSCOPY

## 2023-12-17 ENCOUNTER — Encounter: Payer: 59 | Admitting: Nurse Practitioner

## 2023-12-22 ENCOUNTER — Encounter: Payer: Self-pay | Admitting: Nurse Practitioner

## 2023-12-24 ENCOUNTER — Encounter: Admitting: Nurse Practitioner

## 2024-01-06 LAB — OPHTHALMOLOGY REPORT-SCANNED

## 2024-02-04 ENCOUNTER — Other Ambulatory Visit: Payer: Self-pay | Admitting: Nurse Practitioner

## 2024-02-04 DIAGNOSIS — E782 Mixed hyperlipidemia: Secondary | ICD-10-CM

## 2024-02-04 DIAGNOSIS — E1169 Type 2 diabetes mellitus with other specified complication: Secondary | ICD-10-CM

## 2024-02-17 ENCOUNTER — Encounter: Admitting: Nurse Practitioner

## 2024-04-13 ENCOUNTER — Encounter: Admitting: Nurse Practitioner
